# Patient Record
Sex: Female | Born: 1935 | Race: White | Hispanic: No | Marital: Married
Health system: Southern US, Community
[De-identification: ages and names within clinical notes are randomized; demographics above are authoritative.]

---

## 1997-06-12 ENCOUNTER — Other Ambulatory Visit: Admission: RE | Admit: 1997-06-12 | Discharge: 1997-06-12 | Payer: Self-pay | Admitting: Obstetrics and Gynecology

## 2000-06-29 ENCOUNTER — Encounter: Payer: Self-pay | Admitting: Family Medicine

## 2000-06-29 ENCOUNTER — Ambulatory Visit (HOSPITAL_COMMUNITY): Admission: RE | Admit: 2000-06-29 | Discharge: 2000-06-29 | Payer: Self-pay | Admitting: Family Medicine

## 2000-07-21 ENCOUNTER — Other Ambulatory Visit: Admission: RE | Admit: 2000-07-21 | Discharge: 2000-07-21 | Payer: Self-pay | Admitting: Family Medicine

## 2002-06-21 ENCOUNTER — Ambulatory Visit (HOSPITAL_COMMUNITY): Admission: RE | Admit: 2002-06-21 | Discharge: 2002-06-21 | Payer: Self-pay | Admitting: Family Medicine

## 2002-06-21 ENCOUNTER — Encounter: Payer: Self-pay | Admitting: Family Medicine

## 2002-06-28 ENCOUNTER — Other Ambulatory Visit: Admission: RE | Admit: 2002-06-28 | Discharge: 2002-06-28 | Payer: Self-pay | Admitting: Dermatology

## 2002-08-01 ENCOUNTER — Ambulatory Visit (HOSPITAL_COMMUNITY): Admission: RE | Admit: 2002-08-01 | Discharge: 2002-08-01 | Payer: Self-pay | Admitting: Family Medicine

## 2002-08-01 ENCOUNTER — Encounter: Payer: Self-pay | Admitting: Family Medicine

## 2003-08-03 ENCOUNTER — Ambulatory Visit (HOSPITAL_COMMUNITY): Admission: RE | Admit: 2003-08-03 | Discharge: 2003-08-03 | Payer: Self-pay | Admitting: Family Medicine

## 2004-08-05 ENCOUNTER — Ambulatory Visit (HOSPITAL_COMMUNITY): Admission: RE | Admit: 2004-08-05 | Discharge: 2004-08-05 | Payer: Self-pay | Admitting: Family Medicine

## 2005-01-06 ENCOUNTER — Ambulatory Visit (HOSPITAL_COMMUNITY): Admission: RE | Admit: 2005-01-06 | Discharge: 2005-01-06 | Payer: Self-pay | Admitting: Internal Medicine

## 2005-01-06 ENCOUNTER — Ambulatory Visit: Payer: Self-pay | Admitting: Internal Medicine

## 2005-09-03 ENCOUNTER — Ambulatory Visit (HOSPITAL_COMMUNITY): Admission: RE | Admit: 2005-09-03 | Discharge: 2005-09-03 | Payer: Self-pay | Admitting: Family Medicine

## 2006-07-20 ENCOUNTER — Ambulatory Visit (HOSPITAL_COMMUNITY): Admission: RE | Admit: 2006-07-20 | Discharge: 2006-07-20 | Payer: Self-pay | Admitting: Family Medicine

## 2006-09-07 ENCOUNTER — Ambulatory Visit (HOSPITAL_COMMUNITY): Admission: RE | Admit: 2006-09-07 | Discharge: 2006-09-07 | Payer: Self-pay | Admitting: Family Medicine

## 2007-09-14 ENCOUNTER — Ambulatory Visit (HOSPITAL_COMMUNITY): Admission: RE | Admit: 2007-09-14 | Discharge: 2007-09-14 | Payer: Self-pay | Admitting: Family Medicine

## 2008-08-14 ENCOUNTER — Ambulatory Visit (HOSPITAL_COMMUNITY): Admission: RE | Admit: 2008-08-14 | Discharge: 2008-08-14 | Payer: Self-pay | Admitting: Family Medicine

## 2008-10-02 ENCOUNTER — Ambulatory Visit (HOSPITAL_COMMUNITY): Admission: RE | Admit: 2008-10-02 | Discharge: 2008-10-02 | Payer: Self-pay | Admitting: Family Medicine

## 2009-10-15 ENCOUNTER — Ambulatory Visit (HOSPITAL_COMMUNITY): Admission: RE | Admit: 2009-10-15 | Discharge: 2009-10-15 | Payer: Self-pay | Admitting: Internal Medicine

## 2010-06-27 NOTE — Op Note (Signed)
NAMEAVERIANA, [name]               ACCOUNT NO.:  [id number]   MEDICAL RECORD NO.:  [id number]          PATIENT TYPE:  AMB   LOCATION:  DAY                           FACILITY:  [facility]   PHYSICIAN:  Lionel December, M.D.    DATE OF BIRTH:  1934-05-31   DATE OF PROCEDURE:  01/06/2005  DATE OF DISCHARGE:                                 OPERATIVE REPORT   PROCEDURE:  Colonoscopy.   INDICATION:  [name] is a 76 year old Caucasian female who is here for  screening colonoscopy. Family history is negative for colorectal carcinoma.   Procedure risks were reviewed with the patient, and informed consent was  obtained.   PREMEDICATION:  Demerol 50 mg IV, Versed 3 mg IV in divided dose.   FINDINGS:  Procedure performed in endoscopy suite. The patient's vital signs  and O2 saturation were monitored during the procedure and remained stable.  The patient was placed in left lateral position. Rectal examination  performed. No abnormality noted on external or digital exam. Olympus  videoscope was placed in rectum and advanced under vision into sigmoid colon  and beyond. Preparation was excellent. Scattered diverticula were noted at  sigmoid colon with few more at descending colon. Scope was passed to cecum  which was identified by ileocecal valve and appendiceal orifice. Pictures  taken for the record. As the scope was withdrawn, colonic mucosa was  examined for the second time and there were no polyps and/or tumor masses.  Rectal mucosa similarly was normal. Scope was retroflexed to examine  anorectal junction, and single small papilla was noted along with small  hemorrhoids below the dentate line. Endoscope was straightened and  withdrawn. The patient tolerated the procedure well.   FINAL DIAGNOSIS:  1.  Few scattered diverticula at sigmoid and descending colon.  2.  Small external hemorrhoids with single anal papilla.   RECOMMENDATIONS:  1.  High-fiber diet.  2.  Yearly Hemoccults.  3.  She may  consider next screening exam in 10 years from now.      Lionel December, M.D.  Electronically Signed     NR/MEDQ  D:  01/06/2005  T:  01/06/2005  Job:  [id number]   cc:   Patrica Duel, M.D.  Fax: [phone]

## 2010-09-24 ENCOUNTER — Other Ambulatory Visit (HOSPITAL_COMMUNITY): Payer: Self-pay | Admitting: Internal Medicine

## 2010-09-24 DIAGNOSIS — Z01419 Encounter for gynecological examination (general) (routine) without abnormal findings: Secondary | ICD-10-CM

## 2010-09-24 DIAGNOSIS — Z139 Encounter for screening, unspecified: Secondary | ICD-10-CM

## 2010-09-26 ENCOUNTER — Other Ambulatory Visit (HOSPITAL_COMMUNITY): Payer: Self-pay

## 2010-09-30 ENCOUNTER — Other Ambulatory Visit (HOSPITAL_COMMUNITY): Payer: Self-pay | Admitting: Internal Medicine

## 2010-09-30 DIAGNOSIS — Z139 Encounter for screening, unspecified: Secondary | ICD-10-CM

## 2010-10-17 ENCOUNTER — Ambulatory Visit (HOSPITAL_COMMUNITY)
Admission: RE | Admit: 2010-10-17 | Discharge: 2010-10-17 | Disposition: A | Discharge status: Home / Self Care | Payer: Medicare Other | Source: Ambulatory Visit | Attending: Internal Medicine | Admitting: Internal Medicine

## 2010-10-17 DIAGNOSIS — Z1231 Encounter for screening mammogram for malignant neoplasm of breast: Secondary | ICD-10-CM | POA: Insufficient documentation

## 2010-10-17 DIAGNOSIS — Z139 Encounter for screening, unspecified: Secondary | ICD-10-CM

## 2011-06-10 ENCOUNTER — Other Ambulatory Visit (HOSPITAL_COMMUNITY): Payer: Self-pay | Admitting: Physician Assistant

## 2011-06-10 DIAGNOSIS — R1012 Left upper quadrant pain: Secondary | ICD-10-CM

## 2011-06-16 ENCOUNTER — Ambulatory Visit (HOSPITAL_COMMUNITY)
Admission: RE | Admit: 2011-06-16 | Discharge: 2011-06-16 | Disposition: A | Discharge status: Home / Self Care | Payer: Medicare Other | Source: Ambulatory Visit | Attending: Physician Assistant | Admitting: Physician Assistant

## 2011-06-16 DIAGNOSIS — R1012 Left upper quadrant pain: Secondary | ICD-10-CM

## 2011-06-16 DIAGNOSIS — R109 Unspecified abdominal pain: Secondary | ICD-10-CM | POA: Insufficient documentation

## 2011-06-16 DIAGNOSIS — R918 Other nonspecific abnormal finding of lung field: Secondary | ICD-10-CM | POA: Insufficient documentation

## 2011-09-25 ENCOUNTER — Other Ambulatory Visit (HOSPITAL_COMMUNITY): Payer: Self-pay | Admitting: Internal Medicine

## 2011-09-25 DIAGNOSIS — R109 Unspecified abdominal pain: Secondary | ICD-10-CM

## 2011-09-30 ENCOUNTER — Ambulatory Visit (HOSPITAL_COMMUNITY)
Admission: RE | Admit: 2011-09-30 | Discharge: 2011-09-30 | Disposition: A | Discharge status: Home / Self Care | Payer: Medicare Other | Source: Ambulatory Visit | Attending: Internal Medicine | Admitting: Internal Medicine

## 2011-09-30 DIAGNOSIS — Z01419 Encounter for gynecological examination (general) (routine) without abnormal findings: Secondary | ICD-10-CM

## 2011-09-30 DIAGNOSIS — Z139 Encounter for screening, unspecified: Secondary | ICD-10-CM

## 2011-09-30 DIAGNOSIS — M949 Disorder of cartilage, unspecified: Secondary | ICD-10-CM | POA: Insufficient documentation

## 2011-09-30 DIAGNOSIS — M899 Disorder of bone, unspecified: Secondary | ICD-10-CM | POA: Insufficient documentation

## 2011-10-01 ENCOUNTER — Encounter (INDEPENDENT_AMBULATORY_CARE_PROVIDER_SITE_OTHER): Payer: Self-pay | Admitting: *Deleted

## 2011-10-01 ENCOUNTER — Ambulatory Visit (HOSPITAL_COMMUNITY)
Admission: RE | Admit: 2011-10-01 | Discharge: 2011-10-01 | Disposition: A | Discharge status: Home / Self Care | Payer: Medicare Other | Source: Ambulatory Visit | Attending: Internal Medicine | Admitting: Internal Medicine

## 2011-10-01 ENCOUNTER — Ambulatory Visit (HOSPITAL_COMMUNITY): Payer: Medicare Other

## 2011-10-01 DIAGNOSIS — R109 Unspecified abdominal pain: Secondary | ICD-10-CM

## 2011-10-01 DIAGNOSIS — R1032 Left lower quadrant pain: Secondary | ICD-10-CM | POA: Insufficient documentation

## 2011-10-01 MED ORDER — IOHEXOL 300 MG/ML  SOLN
100.0000 mL | Freq: Once | INTRAMUSCULAR | Status: AC | PRN
  Administered 2011-10-01: 100 mL via INTRAVENOUS

## 2011-11-11 ENCOUNTER — Other Ambulatory Visit (HOSPITAL_COMMUNITY): Payer: Self-pay | Admitting: Internal Medicine

## 2011-11-11 DIAGNOSIS — Z139 Encounter for screening, unspecified: Secondary | ICD-10-CM

## 2011-11-17 ENCOUNTER — Ambulatory Visit (HOSPITAL_COMMUNITY)
Admission: RE | Admit: 2011-11-17 | Discharge: 2011-11-17 | Disposition: A | Discharge status: Home / Self Care | Payer: Medicare Other | Source: Ambulatory Visit | Attending: Internal Medicine | Admitting: Internal Medicine

## 2011-11-17 DIAGNOSIS — Z1231 Encounter for screening mammogram for malignant neoplasm of breast: Secondary | ICD-10-CM | POA: Insufficient documentation

## 2011-11-17 DIAGNOSIS — Z139 Encounter for screening, unspecified: Secondary | ICD-10-CM

## 2011-11-24 ENCOUNTER — Ambulatory Visit (INDEPENDENT_AMBULATORY_CARE_PROVIDER_SITE_OTHER): Payer: Medicare Other | Admitting: Internal Medicine

## 2012-11-24 ENCOUNTER — Other Ambulatory Visit (HOSPITAL_COMMUNITY): Payer: Self-pay | Admitting: Internal Medicine

## 2012-11-24 DIAGNOSIS — Z139 Encounter for screening, unspecified: Secondary | ICD-10-CM

## 2012-11-29 ENCOUNTER — Ambulatory Visit (HOSPITAL_COMMUNITY)
Admission: RE | Admit: 2012-11-29 | Discharge: 2012-11-29 | Disposition: A | Discharge status: Home / Self Care | Payer: Medicare Other | Source: Ambulatory Visit | Attending: Internal Medicine | Admitting: Internal Medicine

## 2012-11-29 DIAGNOSIS — Z139 Encounter for screening, unspecified: Secondary | ICD-10-CM

## 2012-11-29 DIAGNOSIS — Z1231 Encounter for screening mammogram for malignant neoplasm of breast: Secondary | ICD-10-CM | POA: Insufficient documentation

## 2013-11-20 ENCOUNTER — Other Ambulatory Visit (HOSPITAL_COMMUNITY): Payer: Self-pay | Admitting: Internal Medicine

## 2013-11-20 DIAGNOSIS — R109 Unspecified abdominal pain: Secondary | ICD-10-CM

## 2013-11-20 DIAGNOSIS — R319 Hematuria, unspecified: Secondary | ICD-10-CM

## 2013-11-23 ENCOUNTER — Ambulatory Visit (HOSPITAL_COMMUNITY)
Admission: RE | Admit: 2013-11-23 | Discharge: 2013-11-23 | Disposition: A | Discharge status: Home / Self Care | Payer: Medicare PPO | Source: Ambulatory Visit | Attending: Internal Medicine | Admitting: Internal Medicine

## 2013-11-23 DIAGNOSIS — R911 Solitary pulmonary nodule: Secondary | ICD-10-CM | POA: Diagnosis not present

## 2013-11-23 DIAGNOSIS — K5730 Diverticulosis of large intestine without perforation or abscess without bleeding: Secondary | ICD-10-CM | POA: Insufficient documentation

## 2013-11-23 DIAGNOSIS — R319 Hematuria, unspecified: Secondary | ICD-10-CM | POA: Diagnosis not present

## 2013-11-23 DIAGNOSIS — R1032 Left lower quadrant pain: Secondary | ICD-10-CM | POA: Diagnosis present

## 2013-11-23 DIAGNOSIS — I7090 Unspecified atherosclerosis: Secondary | ICD-10-CM | POA: Insufficient documentation

## 2013-11-23 DIAGNOSIS — R109 Unspecified abdominal pain: Secondary | ICD-10-CM

## 2013-11-23 MED ORDER — IOHEXOL 300 MG/ML  SOLN
100.0000 mL | Freq: Once | INTRAMUSCULAR | Status: AC | PRN
  Administered 2013-11-23: 100 mL via INTRAVENOUS

## 2013-11-24 ENCOUNTER — Other Ambulatory Visit (HOSPITAL_COMMUNITY): Payer: Self-pay | Admitting: Internal Medicine

## 2013-11-24 DIAGNOSIS — Z01419 Encounter for gynecological examination (general) (routine) without abnormal findings: Secondary | ICD-10-CM

## 2013-11-24 DIAGNOSIS — Z0000 Encounter for general adult medical examination without abnormal findings: Secondary | ICD-10-CM

## 2013-11-27 ENCOUNTER — Other Ambulatory Visit (HOSPITAL_COMMUNITY): Payer: Self-pay | Admitting: Internal Medicine

## 2013-11-27 DIAGNOSIS — Z0000 Encounter for general adult medical examination without abnormal findings: Secondary | ICD-10-CM

## 2013-12-05 ENCOUNTER — Other Ambulatory Visit (HOSPITAL_COMMUNITY): Payer: Medicare Other

## 2013-12-05 ENCOUNTER — Ambulatory Visit (HOSPITAL_COMMUNITY): Payer: Medicare Other

## 2013-12-07 ENCOUNTER — Ambulatory Visit (HOSPITAL_COMMUNITY)
Admission: RE | Admit: 2013-12-07 | Discharge: 2013-12-07 | Disposition: A | Discharge status: Home / Self Care | Payer: Medicare HMO | Source: Ambulatory Visit | Attending: Internal Medicine | Admitting: Internal Medicine

## 2013-12-07 DIAGNOSIS — I672 Cerebral atherosclerosis: Secondary | ICD-10-CM | POA: Insufficient documentation

## 2013-12-07 DIAGNOSIS — Z0000 Encounter for general adult medical examination without abnormal findings: Secondary | ICD-10-CM | POA: Insufficient documentation

## 2013-12-07 DIAGNOSIS — E780 Pure hypercholesterolemia: Secondary | ICD-10-CM | POA: Diagnosis not present

## 2013-12-14 NOTE — H&P (Signed)
  NTS SOAP Note  Vital Signs:  Vitals as of: 12/14/2013: Systolic 156: Diastolic 78: Heart Rate 77: Temp 20F: Height 545ft 4in: Weight 128Lbs 0 Ounces: BMI 21.97  BMI : 21.97 kg/m2  Subjective: This 79 year old female presents forneed for screening TCS.  Last had a TCS over ten years ago.  No family h/o colon cancer.  No gi complaints.  Review of Symptoms:  Constitutional:unremarkable   Head:unremarkable Eyes:unremarkable   Nose/Mouth/Throat:unremarkable Cardiovascular:  unremarkable Respiratory:unremarkable Gastrointestinal:  unremarkable   Genitourinary:unremarkable   Musculoskeletal:unremarkable Skin:unremarkable Hematolgic/Lymphatic:unremarkable   Allergic/Immunologic:unremarkable   Past Medical History:  Reviewed  Past Medical History  Surgical History: unremarkable Medical Problems: nnone Allergies: nkda Medications: ASA   Social History:Reviewed  Social History  Preferred Language: English Race:  White Ethnicity: Not Hispanic / Latino Age: [age] year Marital Status:  M Alcohol: no   Smoking Status: Never smoker reviewed on 12/12/2013 Functional Status reviewed on 12/12/2013 ------------------------------------------------ Bathing: Normal Cooking: Normal Dressing: Normal Driving: Normal Eating: Normal Managing Meds: Normal Oral Care: Normal Shopping: Normal Toileting: Normal Transferring: Normal Walking: Normal Cognitive Status reviewed on 12/12/2013 ------------------------------------------------ Attention: Normal Decision Making: Normal Language: Normal Memory: Normal Motor: Normal Perception: Normal Problem Solving: Normal Visual and Spatial: Normal   Family History:Reviewed  Family Health History Mother, Deceased; Healthy;  Father, Deceased; Healthy;     Objective Information: General:Well appearing, well nourished in no distress. Heart:RRR, no murmur or gallop.  Normal S1, S2.  No S3, S4.  Lungs:  CTA  bilaterally, no wheezes, rhonchi, rales.  Breathing unlabored. Abdomen:Soft, NT/ND, no HSM, no masses. deferred to procedure  Assessment:Need for screening TCS  Diagnoses: V76.51  Z12.11 Screening for malignant neoplasm of colon (Encounter for screening for malignant neoplasm of colon)  Procedures: [id number] - OFFICE OUTPATIENT NEW 20 MINUTES    Plan:  Scheduled for TCS on 01/09/14.   Patient Education:Alternative treatments to surgery were discussed with patient (and family).  Risks and benefits  of procedure including bleeding and perforation were fully explained to the patient (and family) who gave informed consent. Patient/family questions were addressed.  To hold ASA prior to procedure.  Follow-up:Pending Surgery

## 2014-01-09 ENCOUNTER — Encounter (HOSPITAL_COMMUNITY)
Admission: RE | Disposition: A | Discharge status: Home / Self Care | Payer: Self-pay | Source: Ambulatory Visit | Attending: General Surgery

## 2014-01-09 ENCOUNTER — Ambulatory Visit (HOSPITAL_COMMUNITY)
Admission: RE | Admit: 2014-01-09 | Discharge: 2014-01-09 | Disposition: A | Discharge status: Home / Self Care | Payer: Commercial Managed Care - HMO | Source: Ambulatory Visit | Attending: General Surgery | Admitting: General Surgery

## 2014-01-09 DIAGNOSIS — K5730 Diverticulosis of large intestine without perforation or abscess without bleeding: Secondary | ICD-10-CM | POA: Insufficient documentation

## 2014-01-09 DIAGNOSIS — Z1211 Encounter for screening for malignant neoplasm of colon: Secondary | ICD-10-CM | POA: Diagnosis present

## 2014-01-09 HISTORY — PX: COLONOSCOPY: SHX5424

## 2014-01-09 SURGERY — COLONOSCOPY
Anesthesia: Moderate Sedation

## 2014-01-09 MED ORDER — MIDAZOLAM HCL 5 MG/5ML IJ SOLN
INTRAMUSCULAR | Status: AC
  Filled 2014-01-09: qty 5

## 2014-01-09 MED ORDER — SODIUM CHLORIDE 0.9 % IV SOLN
INTRAVENOUS | Status: DC
  Administered 2014-01-09: 1000 mL via INTRAVENOUS

## 2014-01-09 MED ORDER — MEPERIDINE HCL 50 MG/ML IJ SOLN
INTRAMUSCULAR | Status: DC | PRN
  Administered 2014-01-09: 50 mg via INTRAVENOUS

## 2014-01-09 MED ORDER — MEPERIDINE HCL 50 MG/ML IJ SOLN
INTRAMUSCULAR | Status: AC
  Filled 2014-01-09: qty 1

## 2014-01-09 MED ORDER — MIDAZOLAM HCL 5 MG/5ML IJ SOLN
INTRAMUSCULAR | Status: DC | PRN
Start: 2014-01-09 — End: 2014-01-09
  Administered 2014-01-09: 2 mg via INTRAVENOUS
  Administered 2014-01-09: 1 mg via INTRAVENOUS

## 2014-01-09 MED ORDER — STERILE WATER FOR IRRIGATION IR SOLN
Status: DC | PRN
  Administered 2014-01-09: 09:00:00

## 2014-01-09 NOTE — Interval H&P Note (Signed)
History and Physical Interval Note:  01/09/2014 8:32 AM  Shelby [name]  has presented today for surgery, with the diagnosis of screening  The various methods of treatment have been discussed with the patient and family. After consideration of risks, benefits and other options for treatment, the patient has consented to  Procedure(s) with comments: COLONOSCOPY (N/A) - 8:30 as a surgical intervention .  The patient's history has been reviewed, patient examined, no change in status, stable for surgery.  I have reviewed the patient's chart and labs.  Questions were answered to the patient's satisfaction.     Franky MachoJENKINS,[name]

## 2014-01-09 NOTE — Op Note (Signed)
[facility] [address] [place], [id number]   COLONOSCOPY PROCEDURE REPORT     EXAM DATE: 01/09/2014  PATIENT NAME:      Shelby [name], Shelby [name]           MR #:      [id number]  BIRTHDATE:       11-15-1934      VISIT #:     [id number]  ATTENDING:     Franky MachoMark Darica Goren, MD     STATUS:     outpatient REFERRING MD:      Artis DelayLarry Fusco, M.D. ASA CLASS:        Class I  INDICATIONS:  The patient is a 79 yr old female here for a colonoscopy due to average risk for colon cancer. PROCEDURE PERFORMED:     Colonoscopy, screening MEDICATIONS:     Demerol 50 mg IV and Versed 3 mg IV ESTIMATED BLOOD LOSS:     None  CONSENT: The patient understands the risks and benefits of the procedure and understands that these risks include, but are not limited to: sedation, allergic reaction, infection, perforation and/or bleeding. Alternative means of evaluation and treatment include, among others: physical exam, x-rays, and/or surgical intervention. The patient elects to proceed with this endoscopic procedure.  DESCRIPTION OF PROCEDURE: During intra-op preparation period all mechanical & medical equipment was checked for proper function. Hand hygiene and appropriate measures for infection prevention was taken. After the risks, benefits and alternatives of the procedure were thoroughly explained, Informed consent was verified, confirmed and timeout was successfully executed by the treatment team. A digital exam revealed no abnormalities of the rectum.      The EC-[id number] ([id number] endoscope was introduced through the anus and advanced to the cecum, which was identified by both the appendix and ileocecal valve. No adverse events experienced. The prep was adequate, using Trilyte. The instrument was then slowly withdrawn as the colon was fully examined.   COLON FINDINGS: Diverticula was found in the descending colon.  The opening was medium sized.   The examination was otherwise  normal. Retroflexed views revealed no abnormalities.  The scope was then completely withdrawn from the patient and the procedure terminated.  WITHDRAWAL TIME: 6 minutes 0 seconds    ADVERSE EVENTS:      There were no immediate complications.  IMPRESSIONS:     1.  Diverticula in the descending colon 2.  The examination was otherwise normal  RECOMMENDATIONS:     Return to the care of your primary provider. GI follow up as needed RECALL:  Franky MachoMark Eilan Mcinerny, MD eSigned:  Franky MachoMark Rebbeca Sheperd, MD 01/09/2014 8:55 AM   cc:  CPT CODES: ICD CODES:  The ICD and CPT codes recommended by this software are interpretations from the data that the clinical staff has captured with the software.  The verification of the translation of this report to the ICD and CPT codes and modifiers is the sole responsibility of the health care institution and practicing physician where this report was generated.  [employer] will not be held responsible for the validity of the ICD and CPT codes included on this report.  AMA assumes no liability for data contained or not contained herein. CPT is a Publishing rights managerregistered trademark of the [employer].

## 2014-01-09 NOTE — Discharge Instructions (Signed)

## 2014-01-10 ENCOUNTER — Encounter (HOSPITAL_COMMUNITY): Payer: Self-pay | Admitting: General Surgery

## 2014-05-23 ENCOUNTER — Other Ambulatory Visit (HOSPITAL_COMMUNITY): Payer: Self-pay | Admitting: Internal Medicine

## 2014-05-23 DIAGNOSIS — J984 Other disorders of lung: Secondary | ICD-10-CM

## 2014-05-28 ENCOUNTER — Ambulatory Visit (HOSPITAL_COMMUNITY)
Admission: RE | Admit: 2014-05-28 | Discharge: 2014-05-28 | Disposition: A | Discharge status: Home / Self Care | Payer: Commercial Managed Care - HMO | Source: Ambulatory Visit | Attending: Internal Medicine | Admitting: Internal Medicine

## 2014-05-28 DIAGNOSIS — R918 Other nonspecific abnormal finding of lung field: Secondary | ICD-10-CM | POA: Insufficient documentation

## 2014-05-28 DIAGNOSIS — J984 Other disorders of lung: Secondary | ICD-10-CM | POA: Diagnosis not present

## 2014-12-17 ENCOUNTER — Other Ambulatory Visit (HOSPITAL_COMMUNITY): Payer: Self-pay | Admitting: Internal Medicine

## 2014-12-17 DIAGNOSIS — Z1231 Encounter for screening mammogram for malignant neoplasm of breast: Secondary | ICD-10-CM

## 2014-12-18 DIAGNOSIS — R7309 Other abnormal glucose: Secondary | ICD-10-CM | POA: Diagnosis not present

## 2014-12-18 DIAGNOSIS — Z6821 Body mass index (BMI) 21.0-21.9, adult: Secondary | ICD-10-CM | POA: Diagnosis not present

## 2014-12-18 DIAGNOSIS — Z23 Encounter for immunization: Secondary | ICD-10-CM | POA: Diagnosis not present

## 2014-12-18 DIAGNOSIS — Z1389 Encounter for screening for other disorder: Secondary | ICD-10-CM | POA: Diagnosis not present

## 2014-12-18 DIAGNOSIS — R351 Nocturia: Secondary | ICD-10-CM | POA: Diagnosis not present

## 2014-12-18 DIAGNOSIS — Z0000 Encounter for general adult medical examination without abnormal findings: Secondary | ICD-10-CM | POA: Diagnosis not present

## 2014-12-24 ENCOUNTER — Ambulatory Visit (HOSPITAL_COMMUNITY)
Admission: RE | Admit: 2014-12-24 | Discharge: 2014-12-24 | Disposition: A | Discharge status: Home / Self Care | Payer: Commercial Managed Care - HMO | Source: Ambulatory Visit | Attending: Internal Medicine | Admitting: Internal Medicine

## 2014-12-24 DIAGNOSIS — Z1231 Encounter for screening mammogram for malignant neoplasm of breast: Secondary | ICD-10-CM | POA: Insufficient documentation

## 2014-12-24 DIAGNOSIS — N390 Urinary tract infection, site not specified: Secondary | ICD-10-CM | POA: Diagnosis not present

## 2015-01-09 DIAGNOSIS — Z0100 Encounter for examination of eyes and vision without abnormal findings: Secondary | ICD-10-CM | POA: Diagnosis not present

## 2015-02-05 ENCOUNTER — Other Ambulatory Visit (HOSPITAL_COMMUNITY): Payer: Self-pay | Admitting: Internal Medicine

## 2015-02-05 ENCOUNTER — Ambulatory Visit (HOSPITAL_COMMUNITY)
Admission: RE | Admit: 2015-02-05 | Discharge: 2015-02-05 | Disposition: A | Discharge status: Home / Self Care | Payer: Commercial Managed Care - HMO | Source: Ambulatory Visit | Attending: Internal Medicine | Admitting: Internal Medicine

## 2015-02-05 DIAGNOSIS — Z6822 Body mass index (BMI) 22.0-22.9, adult: Secondary | ICD-10-CM | POA: Diagnosis not present

## 2015-02-05 DIAGNOSIS — M79604 Pain in right leg: Secondary | ICD-10-CM | POA: Insufficient documentation

## 2015-02-05 DIAGNOSIS — M79661 Pain in right lower leg: Secondary | ICD-10-CM | POA: Diagnosis not present

## 2015-02-05 DIAGNOSIS — M779 Enthesopathy, unspecified: Secondary | ICD-10-CM | POA: Diagnosis not present

## 2015-02-05 DIAGNOSIS — Z1389 Encounter for screening for other disorder: Secondary | ICD-10-CM | POA: Diagnosis not present

## 2015-10-23 ENCOUNTER — Other Ambulatory Visit (HOSPITAL_COMMUNITY): Payer: Self-pay | Admitting: Family Medicine

## 2015-10-23 ENCOUNTER — Ambulatory Visit (HOSPITAL_COMMUNITY)
Admission: RE | Admit: 2015-10-23 | Discharge: 2015-10-23 | Disposition: A | Discharge status: Home / Self Care | Payer: Commercial Managed Care - HMO | Source: Ambulatory Visit | Attending: Family Medicine | Admitting: Family Medicine

## 2015-10-23 DIAGNOSIS — M5431 Sciatica, right side: Secondary | ICD-10-CM

## 2015-10-23 DIAGNOSIS — M5432 Sciatica, left side: Principal | ICD-10-CM

## 2015-10-23 DIAGNOSIS — M25552 Pain in left hip: Secondary | ICD-10-CM | POA: Diagnosis not present

## 2015-10-23 DIAGNOSIS — Z1389 Encounter for screening for other disorder: Secondary | ICD-10-CM | POA: Insufficient documentation

## 2015-10-23 DIAGNOSIS — Z6821 Body mass index (BMI) 21.0-21.9, adult: Secondary | ICD-10-CM | POA: Diagnosis not present

## 2015-12-19 ENCOUNTER — Other Ambulatory Visit (HOSPITAL_COMMUNITY): Payer: Self-pay | Admitting: Internal Medicine

## 2015-12-19 DIAGNOSIS — Z1231 Encounter for screening mammogram for malignant neoplasm of breast: Secondary | ICD-10-CM

## 2015-12-24 DIAGNOSIS — Z23 Encounter for immunization: Secondary | ICD-10-CM | POA: Diagnosis not present

## 2016-01-06 ENCOUNTER — Ambulatory Visit (HOSPITAL_COMMUNITY)
Admission: RE | Admit: 2016-01-06 | Discharge: 2016-01-06 | Disposition: A | Discharge status: Home / Self Care | Payer: Commercial Managed Care - HMO | Source: Ambulatory Visit | Attending: Internal Medicine | Admitting: Internal Medicine

## 2016-01-06 DIAGNOSIS — R928 Other abnormal and inconclusive findings on diagnostic imaging of breast: Secondary | ICD-10-CM | POA: Diagnosis not present

## 2016-01-06 DIAGNOSIS — Z1231 Encounter for screening mammogram for malignant neoplasm of breast: Secondary | ICD-10-CM

## 2016-01-09 ENCOUNTER — Other Ambulatory Visit: Payer: Self-pay | Admitting: Internal Medicine

## 2016-01-09 DIAGNOSIS — R928 Other abnormal and inconclusive findings on diagnostic imaging of breast: Secondary | ICD-10-CM

## 2016-01-28 ENCOUNTER — Ambulatory Visit (HOSPITAL_COMMUNITY)
Admission: RE | Admit: 2016-01-28 | Discharge: 2016-01-28 | Disposition: A | Discharge status: Home / Self Care | Payer: Commercial Managed Care - HMO | Source: Ambulatory Visit | Attending: Internal Medicine | Admitting: Internal Medicine

## 2016-01-28 ENCOUNTER — Encounter (HOSPITAL_COMMUNITY): Payer: Commercial Managed Care - HMO

## 2016-01-28 DIAGNOSIS — R928 Other abnormal and inconclusive findings on diagnostic imaging of breast: Secondary | ICD-10-CM

## 2016-01-28 DIAGNOSIS — N6310 Unspecified lump in the right breast, unspecified quadrant: Secondary | ICD-10-CM | POA: Diagnosis not present

## 2016-01-28 DIAGNOSIS — N6001 Solitary cyst of right breast: Secondary | ICD-10-CM | POA: Diagnosis not present

## 2016-02-12 DIAGNOSIS — E748 Other specified disorders of carbohydrate metabolism: Secondary | ICD-10-CM | POA: Diagnosis not present

## 2016-02-12 DIAGNOSIS — Z6821 Body mass index (BMI) 21.0-21.9, adult: Secondary | ICD-10-CM | POA: Diagnosis not present

## 2016-02-12 DIAGNOSIS — N3000 Acute cystitis without hematuria: Secondary | ICD-10-CM | POA: Diagnosis not present

## 2016-02-12 DIAGNOSIS — R7309 Other abnormal glucose: Secondary | ICD-10-CM | POA: Diagnosis not present

## 2016-02-12 DIAGNOSIS — Z1389 Encounter for screening for other disorder: Secondary | ICD-10-CM | POA: Diagnosis not present

## 2016-02-12 DIAGNOSIS — Z0000 Encounter for general adult medical examination without abnormal findings: Secondary | ICD-10-CM | POA: Diagnosis not present

## 2016-02-12 DIAGNOSIS — R300 Dysuria: Secondary | ICD-10-CM | POA: Diagnosis not present

## 2016-02-20 DIAGNOSIS — N3000 Acute cystitis without hematuria: Secondary | ICD-10-CM | POA: Diagnosis not present

## 2016-06-14 IMAGING — US US CAROTID DUPLEX BILAT
1 series · 13 of 24 positions shown · non-contrast
Comparison: None.

CLINICAL DATA: Elevated cholesterol level.

EXAM:
BILATERAL CAROTID DUPLEX ULTRASOUND
TECHNIQUE: Gray scale imaging, color Doppler and duplex ultrasound were
performed of bilateral carotid and vertebral arteries in the neck.

[Series 1: us carotid duplex bilat · 0.04mm/px · 13 of 70 slices shown]
[im 1/70]
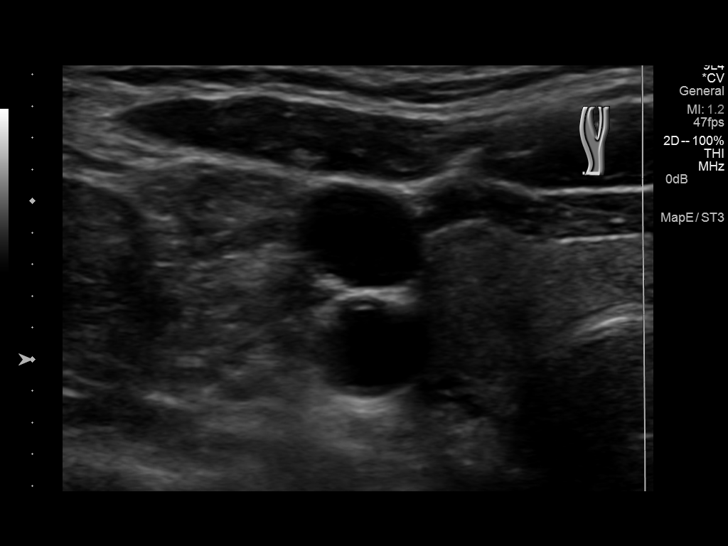
[im 7/70]
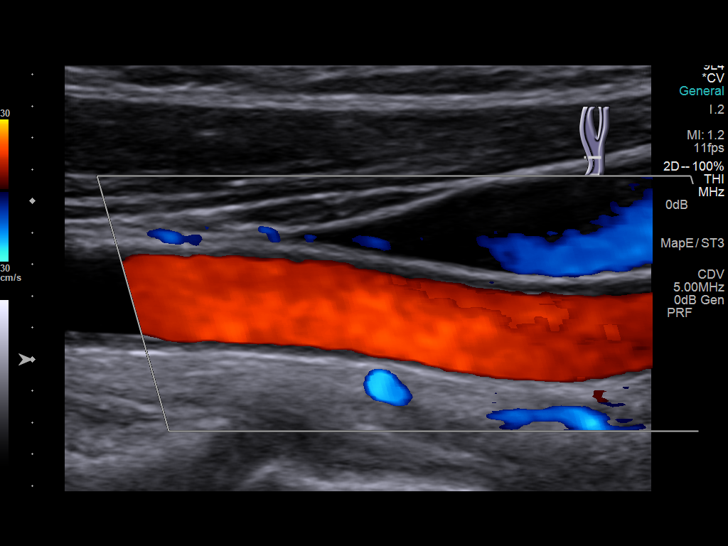
[im 13/70]
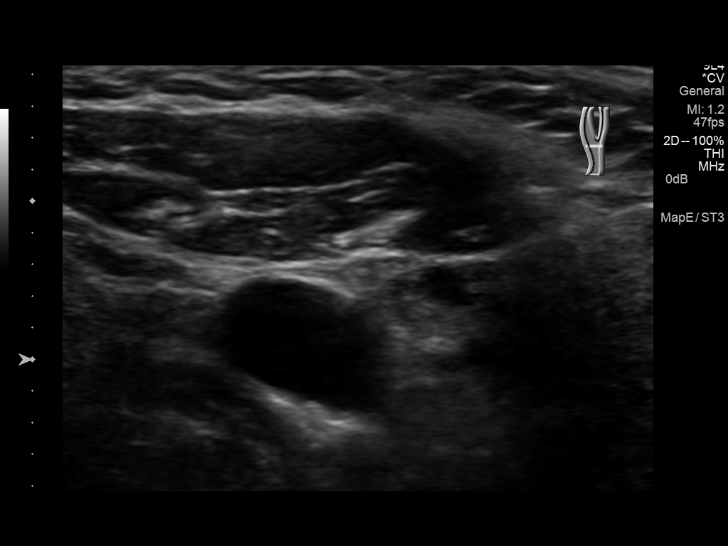
[im 19/70]
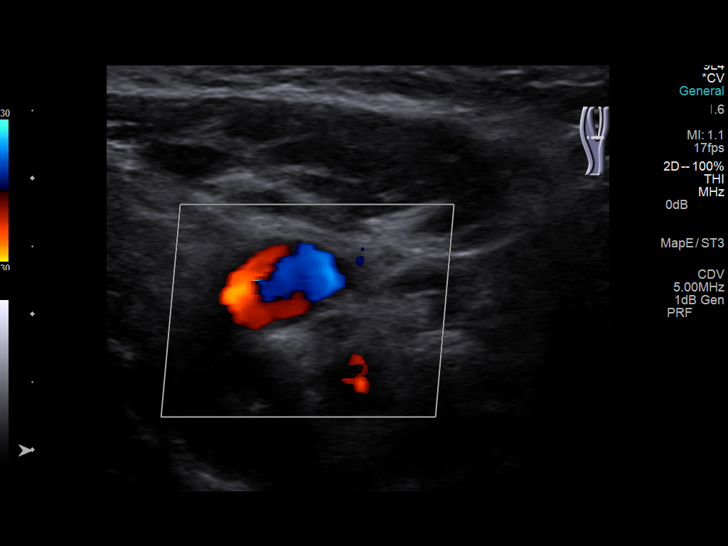
[im 25/70]
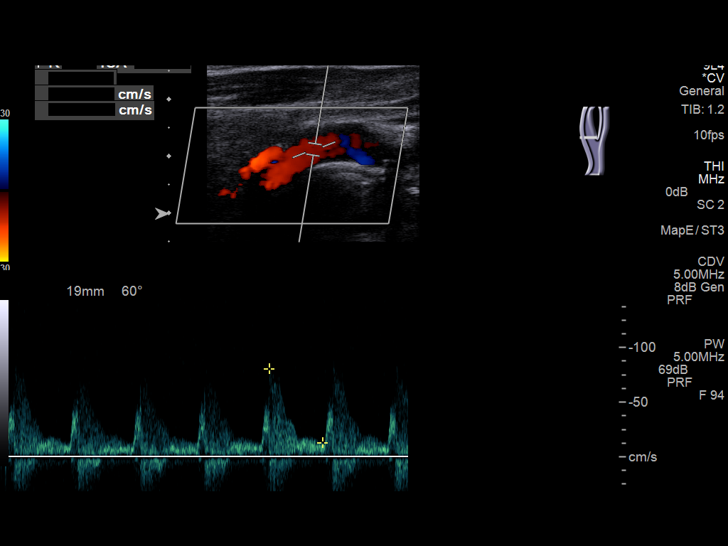
[im 31/70]
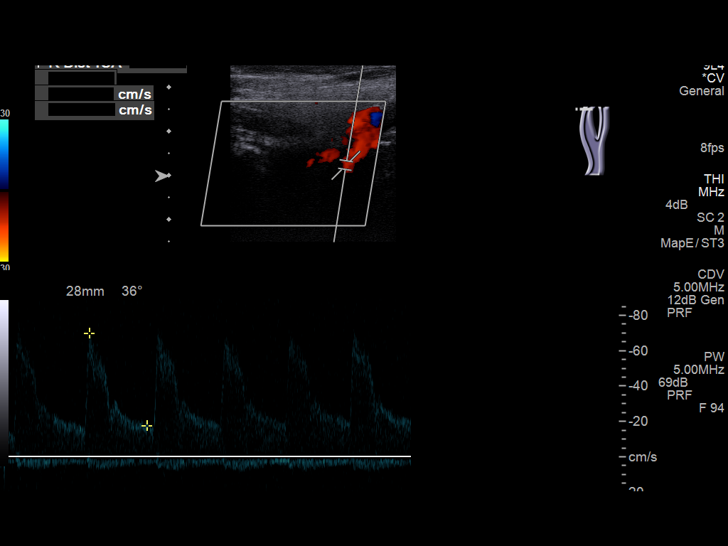
[im 37/70]
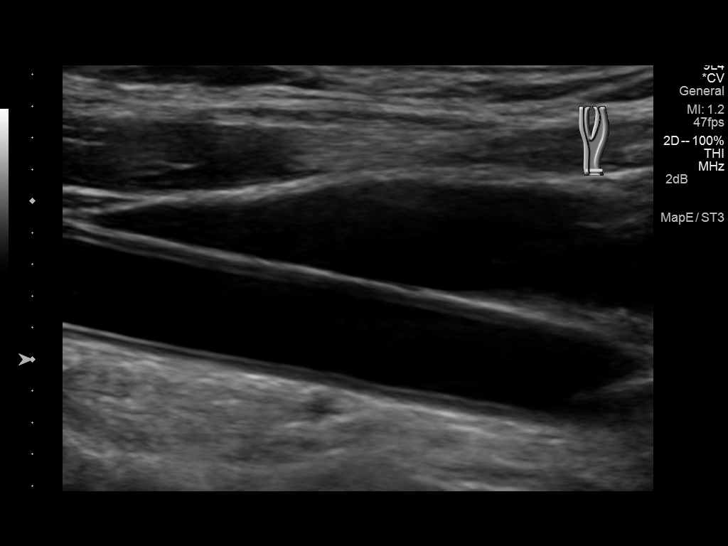
[im 40/70]
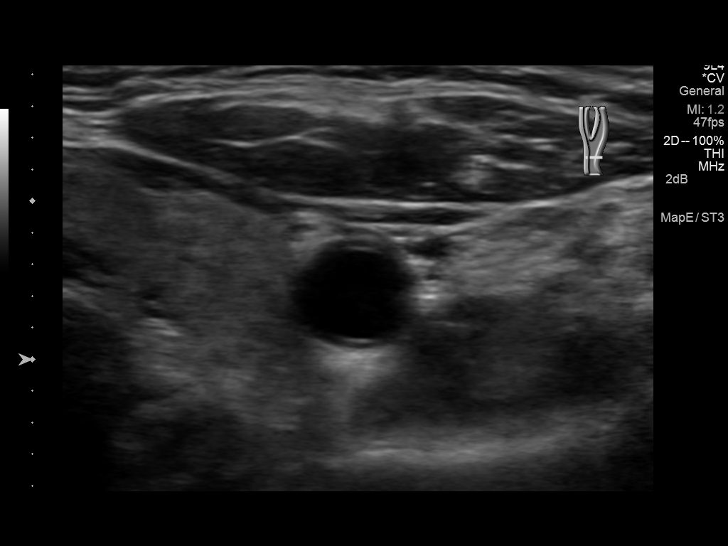
[im 46/70]
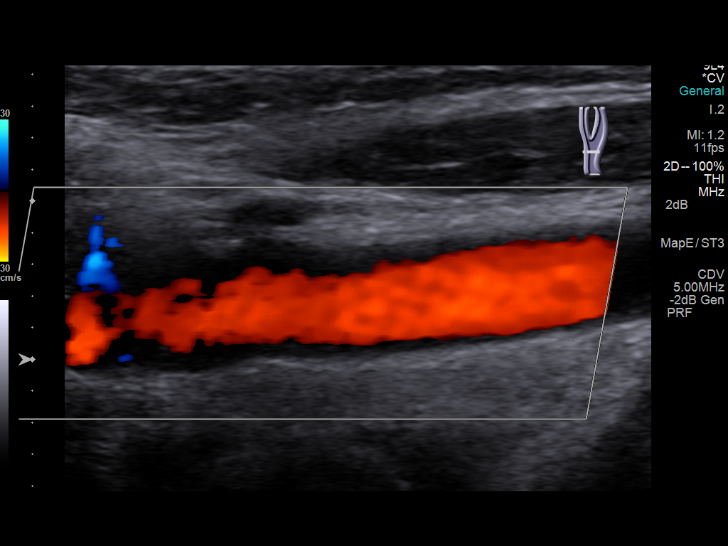
[im 52/70]
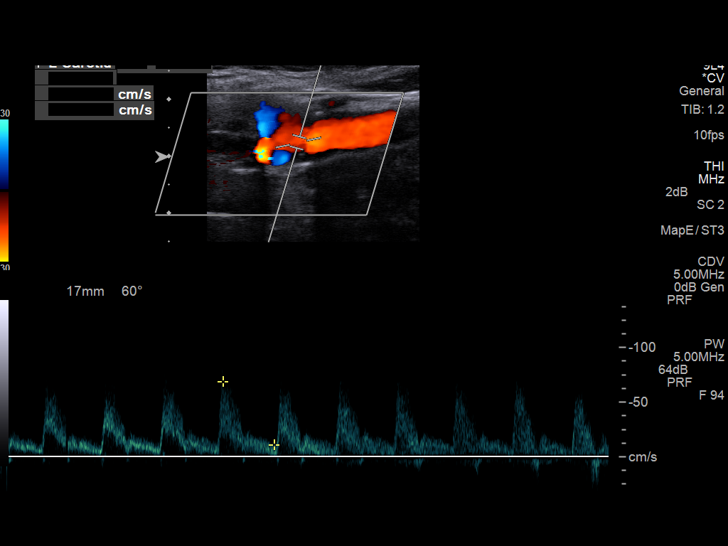
[im 58/70]
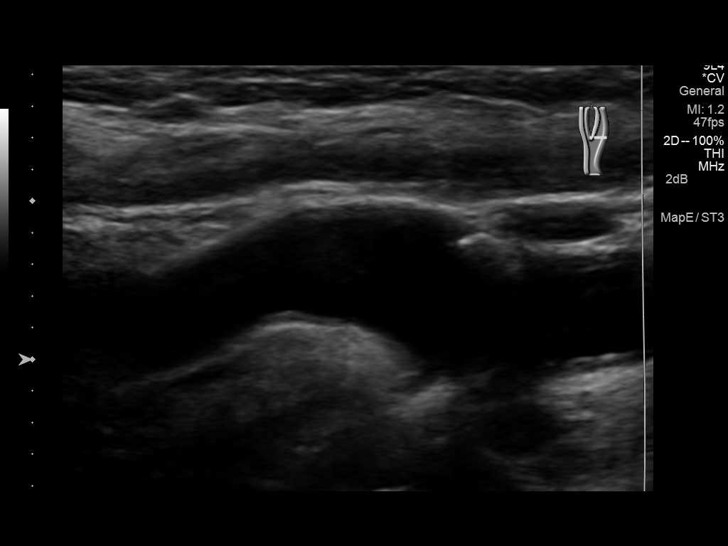
[im 64/70]
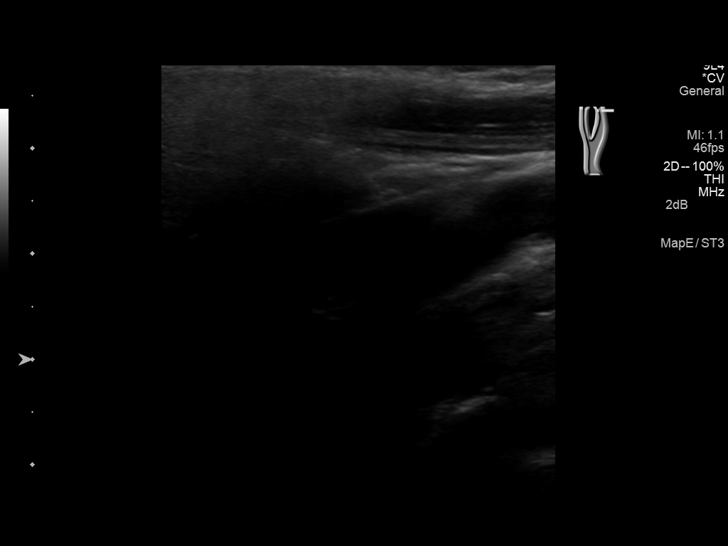
[im 70/70]
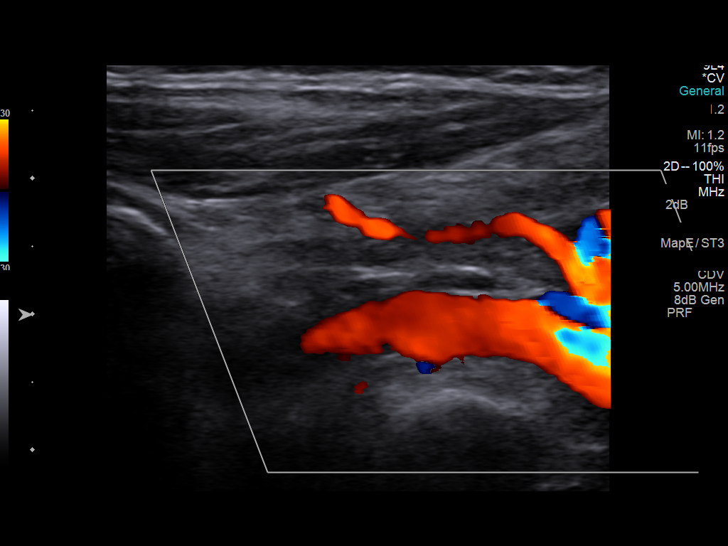

[13 of 24 positions shown; findings below may reference images not displayed]

FINDINGS: Criteria: Quantification of carotid stenosis is based on velocity
parameters that correlate the residual internal carotid diameter
with NASCET-based stenosis levels, using the diameter of the distal
internal carotid lumen as the denominator for stenosis measurement.

The following velocity measurements were obtained:

RIGHT

ICA:  81/13 cm/sec

CCA:  115/17 cm/sec

SYSTOLIC ICA/CCA RATIO:

DIASTOLIC ICA/CCA RATIO:

ECA:  82 cm/sec

LEFT

ICA:  73/17 cm/sec

CCA:  92/13 cm/sec

SYSTOLIC ICA/CCA RATIO:

DIASTOLIC ICA/CCA RATIO:

ECA:  56 cm/sec

RIGHT CAROTID ARTERY: Mild atheromatous disease is noted in the
right carotid bulb and proximal right internal carotid artery
consistent with less than 50% diameter stenosis based on ultrasound
and Doppler criteria.

RIGHT VERTEBRAL ARTERY:  Antegrade flow is noted.

LEFT CAROTID ARTERY: Mild atheromatous disease is noted in the left
carotid bulb and proximal left internal carotid artery.

LEFT VERTEBRAL ARTERY:  Antegrade flow is noted.
IMPRESSION: No hemodynamically significant stenosis or plaque is noted in either
cervical carotid artery.

## 2016-11-26 DIAGNOSIS — Z23 Encounter for immunization: Secondary | ICD-10-CM | POA: Diagnosis not present

## 2017-01-05 ENCOUNTER — Other Ambulatory Visit (HOSPITAL_COMMUNITY): Payer: Self-pay | Admitting: Internal Medicine

## 2017-01-05 DIAGNOSIS — Z1231 Encounter for screening mammogram for malignant neoplasm of breast: Secondary | ICD-10-CM

## 2017-01-29 ENCOUNTER — Ambulatory Visit (HOSPITAL_COMMUNITY)
Admission: RE | Admit: 2017-01-29 | Discharge: 2017-01-29 | Disposition: A | Discharge status: Home / Self Care | Payer: Medicare HMO | Source: Ambulatory Visit | Attending: Internal Medicine | Admitting: Internal Medicine

## 2017-01-29 DIAGNOSIS — Z1231 Encounter for screening mammogram for malignant neoplasm of breast: Secondary | ICD-10-CM | POA: Insufficient documentation

## 2017-02-04 ENCOUNTER — Other Ambulatory Visit (HOSPITAL_COMMUNITY): Payer: Self-pay | Admitting: Internal Medicine

## 2017-02-04 DIAGNOSIS — R928 Other abnormal and inconclusive findings on diagnostic imaging of breast: Secondary | ICD-10-CM

## 2017-02-12 ENCOUNTER — Other Ambulatory Visit (HOSPITAL_COMMUNITY): Payer: Self-pay | Admitting: Internal Medicine

## 2017-02-12 DIAGNOSIS — N390 Urinary tract infection, site not specified: Secondary | ICD-10-CM | POA: Diagnosis not present

## 2017-02-12 DIAGNOSIS — Z6821 Body mass index (BMI) 21.0-21.9, adult: Secondary | ICD-10-CM | POA: Diagnosis not present

## 2017-02-12 DIAGNOSIS — E748 Other specified disorders of carbohydrate metabolism: Secondary | ICD-10-CM | POA: Diagnosis not present

## 2017-02-12 DIAGNOSIS — Z0001 Encounter for general adult medical examination with abnormal findings: Secondary | ICD-10-CM | POA: Diagnosis not present

## 2017-02-12 DIAGNOSIS — R0989 Other specified symptoms and signs involving the circulatory and respiratory systems: Secondary | ICD-10-CM

## 2017-02-12 DIAGNOSIS — R351 Nocturia: Secondary | ICD-10-CM | POA: Diagnosis not present

## 2017-02-12 DIAGNOSIS — Z1389 Encounter for screening for other disorder: Secondary | ICD-10-CM | POA: Diagnosis not present

## 2017-02-12 DIAGNOSIS — R7309 Other abnormal glucose: Secondary | ICD-10-CM | POA: Diagnosis not present

## 2017-02-12 DIAGNOSIS — E782 Mixed hyperlipidemia: Secondary | ICD-10-CM | POA: Diagnosis not present

## 2017-02-16 ENCOUNTER — Ambulatory Visit (HOSPITAL_COMMUNITY)
Admission: RE | Admit: 2017-02-16 | Discharge: 2017-02-16 | Disposition: A | Discharge status: Home / Self Care | Payer: Medicare HMO | Source: Ambulatory Visit | Attending: Internal Medicine | Admitting: Internal Medicine

## 2017-02-16 DIAGNOSIS — N6489 Other specified disorders of breast: Secondary | ICD-10-CM | POA: Diagnosis not present

## 2017-02-16 DIAGNOSIS — R928 Other abnormal and inconclusive findings on diagnostic imaging of breast: Secondary | ICD-10-CM | POA: Diagnosis not present

## 2017-02-16 DIAGNOSIS — I6523 Occlusion and stenosis of bilateral carotid arteries: Secondary | ICD-10-CM | POA: Insufficient documentation

## 2017-02-16 DIAGNOSIS — R0989 Other specified symptoms and signs involving the circulatory and respiratory systems: Secondary | ICD-10-CM | POA: Diagnosis present

## 2017-02-24 DIAGNOSIS — N390 Urinary tract infection, site not specified: Secondary | ICD-10-CM | POA: Diagnosis not present

## 2017-03-11 DIAGNOSIS — E748 Other specified disorders of carbohydrate metabolism: Secondary | ICD-10-CM | POA: Diagnosis not present

## 2017-03-11 DIAGNOSIS — R7309 Other abnormal glucose: Secondary | ICD-10-CM | POA: Diagnosis not present

## 2017-11-30 DIAGNOSIS — Z23 Encounter for immunization: Secondary | ICD-10-CM | POA: Diagnosis not present

## 2018-01-17 ENCOUNTER — Other Ambulatory Visit (HOSPITAL_COMMUNITY): Payer: Self-pay | Admitting: Internal Medicine

## 2018-01-17 DIAGNOSIS — Z1231 Encounter for screening mammogram for malignant neoplasm of breast: Secondary | ICD-10-CM

## 2018-02-07 ENCOUNTER — Ambulatory Visit (HOSPITAL_COMMUNITY)
Admission: RE | Admit: 2018-02-07 | Discharge: 2018-02-07 | Disposition: A | Discharge status: Home / Self Care | Payer: Medicare HMO | Source: Ambulatory Visit | Attending: Internal Medicine | Admitting: Internal Medicine

## 2018-02-07 DIAGNOSIS — Z1231 Encounter for screening mammogram for malignant neoplasm of breast: Secondary | ICD-10-CM | POA: Diagnosis not present

## 2018-03-16 DIAGNOSIS — Z1389 Encounter for screening for other disorder: Secondary | ICD-10-CM | POA: Diagnosis not present

## 2018-03-16 DIAGNOSIS — Z6820 Body mass index (BMI) 20.0-20.9, adult: Secondary | ICD-10-CM | POA: Diagnosis not present

## 2018-03-16 DIAGNOSIS — N390 Urinary tract infection, site not specified: Secondary | ICD-10-CM | POA: Diagnosis not present

## 2018-03-16 DIAGNOSIS — E748 Other specified disorders of carbohydrate metabolism: Secondary | ICD-10-CM | POA: Diagnosis not present

## 2018-03-16 DIAGNOSIS — R300 Dysuria: Secondary | ICD-10-CM | POA: Diagnosis not present

## 2018-03-16 DIAGNOSIS — N342 Other urethritis: Secondary | ICD-10-CM | POA: Diagnosis not present

## 2018-03-16 DIAGNOSIS — Z0000 Encounter for general adult medical examination without abnormal findings: Secondary | ICD-10-CM | POA: Diagnosis not present

## 2018-03-16 DIAGNOSIS — Z0001 Encounter for general adult medical examination with abnormal findings: Secondary | ICD-10-CM | POA: Diagnosis not present

## 2018-03-23 DIAGNOSIS — R319 Hematuria, unspecified: Secondary | ICD-10-CM | POA: Diagnosis not present

## 2018-07-14 ENCOUNTER — Other Ambulatory Visit (HOSPITAL_COMMUNITY): Payer: Self-pay | Admitting: Internal Medicine

## 2018-07-14 ENCOUNTER — Ambulatory Visit (HOSPITAL_COMMUNITY)
Admission: RE | Admit: 2018-07-14 | Discharge: 2018-07-14 | Disposition: A | Discharge status: Home / Self Care | Payer: Medicare HMO | Source: Ambulatory Visit | Attending: Internal Medicine | Admitting: Internal Medicine

## 2018-07-14 ENCOUNTER — Other Ambulatory Visit: Payer: Self-pay

## 2018-07-14 DIAGNOSIS — R300 Dysuria: Secondary | ICD-10-CM | POA: Diagnosis not present

## 2018-07-14 DIAGNOSIS — Z6820 Body mass index (BMI) 20.0-20.9, adult: Secondary | ICD-10-CM | POA: Diagnosis not present

## 2018-07-14 DIAGNOSIS — R630 Anorexia: Secondary | ICD-10-CM | POA: Diagnosis not present

## 2018-07-14 DIAGNOSIS — R634 Abnormal weight loss: Secondary | ICD-10-CM | POA: Diagnosis not present

## 2018-07-14 DIAGNOSIS — N390 Urinary tract infection, site not specified: Secondary | ICD-10-CM | POA: Diagnosis not present

## 2018-07-14 DIAGNOSIS — Z1389 Encounter for screening for other disorder: Secondary | ICD-10-CM | POA: Diagnosis not present

## 2018-07-14 DIAGNOSIS — M545 Low back pain: Secondary | ICD-10-CM | POA: Diagnosis not present

## 2018-07-14 DIAGNOSIS — M549 Dorsalgia, unspecified: Secondary | ICD-10-CM | POA: Insufficient documentation

## 2018-07-21 DIAGNOSIS — S22080A Wedge compression fracture of T11-T12 vertebra, initial encounter for closed fracture: Secondary | ICD-10-CM | POA: Diagnosis not present

## 2018-07-26 DIAGNOSIS — N390 Urinary tract infection, site not specified: Secondary | ICD-10-CM | POA: Diagnosis not present

## 2018-09-01 DIAGNOSIS — S22080A Wedge compression fracture of T11-T12 vertebra, initial encounter for closed fracture: Secondary | ICD-10-CM | POA: Diagnosis not present

## 2018-11-23 DIAGNOSIS — R5383 Other fatigue: Secondary | ICD-10-CM | POA: Diagnosis not present

## 2018-11-23 DIAGNOSIS — Z6821 Body mass index (BMI) 21.0-21.9, adult: Secondary | ICD-10-CM | POA: Diagnosis not present

## 2018-11-23 DIAGNOSIS — G894 Chronic pain syndrome: Secondary | ICD-10-CM | POA: Diagnosis not present

## 2018-11-23 DIAGNOSIS — Z23 Encounter for immunization: Secondary | ICD-10-CM | POA: Diagnosis not present

## 2018-11-23 DIAGNOSIS — N390 Urinary tract infection, site not specified: Secondary | ICD-10-CM | POA: Diagnosis not present

## 2018-12-02 DIAGNOSIS — N390 Urinary tract infection, site not specified: Secondary | ICD-10-CM | POA: Diagnosis not present

## 2019-08-17 DIAGNOSIS — E7489 Other specified disorders of carbohydrate metabolism: Secondary | ICD-10-CM | POA: Diagnosis not present

## 2019-08-17 DIAGNOSIS — Z6820 Body mass index (BMI) 20.0-20.9, adult: Secondary | ICD-10-CM | POA: Diagnosis not present

## 2019-08-17 DIAGNOSIS — Z1389 Encounter for screening for other disorder: Secondary | ICD-10-CM | POA: Diagnosis not present

## 2019-08-17 DIAGNOSIS — R5383 Other fatigue: Secondary | ICD-10-CM | POA: Diagnosis not present

## 2019-08-17 DIAGNOSIS — G4762 Sleep related leg cramps: Secondary | ICD-10-CM | POA: Diagnosis not present

## 2019-08-17 DIAGNOSIS — E782 Mixed hyperlipidemia: Secondary | ICD-10-CM | POA: Diagnosis not present

## 2019-08-17 DIAGNOSIS — Z0000 Encounter for general adult medical examination without abnormal findings: Secondary | ICD-10-CM | POA: Diagnosis not present

## 2019-08-17 DIAGNOSIS — M1990 Unspecified osteoarthritis, unspecified site: Secondary | ICD-10-CM | POA: Diagnosis not present

## 2019-08-17 DIAGNOSIS — E538 Deficiency of other specified B group vitamins: Secondary | ICD-10-CM | POA: Diagnosis not present

## 2019-08-17 DIAGNOSIS — G894 Chronic pain syndrome: Secondary | ICD-10-CM | POA: Diagnosis not present

## 2019-08-17 DIAGNOSIS — R7309 Other abnormal glucose: Secondary | ICD-10-CM | POA: Diagnosis not present

## 2019-08-18 DIAGNOSIS — G894 Chronic pain syndrome: Secondary | ICD-10-CM | POA: Diagnosis not present

## 2019-08-18 DIAGNOSIS — Z1389 Encounter for screening for other disorder: Secondary | ICD-10-CM | POA: Diagnosis not present

## 2019-08-18 DIAGNOSIS — Z0000 Encounter for general adult medical examination without abnormal findings: Secondary | ICD-10-CM | POA: Diagnosis not present

## 2019-08-18 DIAGNOSIS — Z6820 Body mass index (BMI) 20.0-20.9, adult: Secondary | ICD-10-CM | POA: Diagnosis not present

## 2019-08-18 DIAGNOSIS — G4762 Sleep related leg cramps: Secondary | ICD-10-CM | POA: Diagnosis not present

## 2019-08-18 DIAGNOSIS — M1990 Unspecified osteoarthritis, unspecified site: Secondary | ICD-10-CM | POA: Diagnosis not present

## 2019-08-18 DIAGNOSIS — R5383 Other fatigue: Secondary | ICD-10-CM | POA: Diagnosis not present

## 2019-08-25 DIAGNOSIS — N390 Urinary tract infection, site not specified: Secondary | ICD-10-CM | POA: Diagnosis not present

## 2020-10-02 DIAGNOSIS — Z1331 Encounter for screening for depression: Secondary | ICD-10-CM | POA: Diagnosis not present

## 2020-10-02 DIAGNOSIS — G894 Chronic pain syndrome: Secondary | ICD-10-CM | POA: Diagnosis not present

## 2020-10-02 DIAGNOSIS — Z0001 Encounter for general adult medical examination with abnormal findings: Secondary | ICD-10-CM | POA: Diagnosis not present

## 2020-10-02 DIAGNOSIS — Z6820 Body mass index (BMI) 20.0-20.9, adult: Secondary | ICD-10-CM | POA: Diagnosis not present

## 2020-10-02 DIAGNOSIS — Z1389 Encounter for screening for other disorder: Secondary | ICD-10-CM | POA: Diagnosis not present

## 2020-10-02 DIAGNOSIS — R5383 Other fatigue: Secondary | ICD-10-CM | POA: Diagnosis not present

## 2020-10-02 DIAGNOSIS — R3129 Other microscopic hematuria: Secondary | ICD-10-CM | POA: Diagnosis not present

## 2020-10-02 DIAGNOSIS — E7489 Other specified disorders of carbohydrate metabolism: Secondary | ICD-10-CM | POA: Diagnosis not present

## 2020-10-17 DIAGNOSIS — R3129 Other microscopic hematuria: Secondary | ICD-10-CM | POA: Diagnosis not present

## 2021-02-26 DIAGNOSIS — J329 Chronic sinusitis, unspecified: Secondary | ICD-10-CM | POA: Diagnosis not present

## 2021-02-26 DIAGNOSIS — J309 Allergic rhinitis, unspecified: Secondary | ICD-10-CM | POA: Diagnosis not present

## 2021-07-15 DIAGNOSIS — E538 Deficiency of other specified B group vitamins: Secondary | ICD-10-CM | POA: Diagnosis not present

## 2021-07-15 DIAGNOSIS — G20 Parkinson's disease: Secondary | ICD-10-CM | POA: Diagnosis not present

## 2021-07-15 DIAGNOSIS — N390 Urinary tract infection, site not specified: Secondary | ICD-10-CM | POA: Diagnosis not present

## 2021-07-15 DIAGNOSIS — Z6820 Body mass index (BMI) 20.0-20.9, adult: Secondary | ICD-10-CM | POA: Diagnosis not present

## 2021-07-15 DIAGNOSIS — Z0001 Encounter for general adult medical examination with abnormal findings: Secondary | ICD-10-CM | POA: Diagnosis not present

## 2021-07-15 DIAGNOSIS — Z1331 Encounter for screening for depression: Secondary | ICD-10-CM | POA: Diagnosis not present

## 2021-07-15 DIAGNOSIS — E559 Vitamin D deficiency, unspecified: Secondary | ICD-10-CM | POA: Diagnosis not present

## 2021-07-15 DIAGNOSIS — E782 Mixed hyperlipidemia: Secondary | ICD-10-CM | POA: Diagnosis not present

## 2021-07-15 DIAGNOSIS — Z9229 Personal history of other drug therapy: Secondary | ICD-10-CM | POA: Diagnosis not present

## 2021-07-16 DIAGNOSIS — N390 Urinary tract infection, site not specified: Secondary | ICD-10-CM | POA: Diagnosis not present

## 2021-09-10 DIAGNOSIS — Z79899 Other long term (current) drug therapy: Secondary | ICD-10-CM | POA: Diagnosis not present

## 2021-09-10 DIAGNOSIS — R471 Dysarthria and anarthria: Secondary | ICD-10-CM | POA: Diagnosis not present

## 2021-09-10 DIAGNOSIS — G20 Parkinson's disease: Secondary | ICD-10-CM | POA: Diagnosis not present

## 2021-10-19 DIAGNOSIS — R5383 Other fatigue: Secondary | ICD-10-CM | POA: Diagnosis not present

## 2021-10-19 DIAGNOSIS — G20 Parkinson's disease: Secondary | ICD-10-CM | POA: Diagnosis not present

## 2021-10-19 DIAGNOSIS — R030 Elevated blood-pressure reading, without diagnosis of hypertension: Secondary | ICD-10-CM | POA: Diagnosis not present

## 2021-10-20 ENCOUNTER — Emergency Department (HOSPITAL_COMMUNITY): Payer: Medicare HMO

## 2021-10-20 ENCOUNTER — Inpatient Hospital Stay (HOSPITAL_COMMUNITY)
Admission: EM | Admit: 2021-10-20 | Discharge: 2021-11-09 | DRG: 917 | Disposition: E | Payer: Medicare HMO | Attending: Family Medicine | Admitting: Family Medicine

## 2021-10-20 ENCOUNTER — Encounter (HOSPITAL_COMMUNITY): Payer: Self-pay | Admitting: Emergency Medicine

## 2021-10-20 ENCOUNTER — Other Ambulatory Visit: Payer: Self-pay

## 2021-10-20 DIAGNOSIS — I161 Hypertensive emergency: Secondary | ICD-10-CM | POA: Diagnosis present

## 2021-10-20 DIAGNOSIS — Z66 Do not resuscitate: Secondary | ICD-10-CM | POA: Diagnosis present

## 2021-10-20 DIAGNOSIS — E872 Acidosis, unspecified: Secondary | ICD-10-CM | POA: Diagnosis present

## 2021-10-20 DIAGNOSIS — I469 Cardiac arrest, cause unspecified: Secondary | ICD-10-CM | POA: Diagnosis not present

## 2021-10-20 DIAGNOSIS — R778 Other specified abnormalities of plasma proteins: Secondary | ICD-10-CM

## 2021-10-20 DIAGNOSIS — F0280 Dementia in other diseases classified elsewhere without behavioral disturbance: Secondary | ICD-10-CM | POA: Diagnosis present

## 2021-10-20 DIAGNOSIS — G9341 Metabolic encephalopathy: Secondary | ICD-10-CM | POA: Diagnosis present

## 2021-10-20 DIAGNOSIS — T452X1A Poisoning by vitamins, accidental (unintentional), initial encounter: Principal | ICD-10-CM | POA: Diagnosis present

## 2021-10-20 DIAGNOSIS — Z515 Encounter for palliative care: Secondary | ICD-10-CM

## 2021-10-20 DIAGNOSIS — R1310 Dysphagia, unspecified: Secondary | ICD-10-CM | POA: Diagnosis present

## 2021-10-20 DIAGNOSIS — I248 Other forms of acute ischemic heart disease: Secondary | ICD-10-CM | POA: Diagnosis present

## 2021-10-20 DIAGNOSIS — I453 Trifascicular block: Secondary | ICD-10-CM | POA: Diagnosis present

## 2021-10-20 DIAGNOSIS — I10 Essential (primary) hypertension: Secondary | ICD-10-CM | POA: Diagnosis present

## 2021-10-20 DIAGNOSIS — E8809 Other disorders of plasma-protein metabolism, not elsewhere classified: Secondary | ICD-10-CM | POA: Diagnosis present

## 2021-10-20 DIAGNOSIS — R531 Weakness: Secondary | ICD-10-CM | POA: Diagnosis not present

## 2021-10-20 DIAGNOSIS — S0990XA Unspecified injury of head, initial encounter: Secondary | ICD-10-CM | POA: Diagnosis not present

## 2021-10-20 DIAGNOSIS — E785 Hyperlipidemia, unspecified: Secondary | ICD-10-CM | POA: Diagnosis present

## 2021-10-20 DIAGNOSIS — I160 Hypertensive urgency: Secondary | ICD-10-CM | POA: Diagnosis present

## 2021-10-20 DIAGNOSIS — I252 Old myocardial infarction: Secondary | ICD-10-CM

## 2021-10-20 DIAGNOSIS — E673 Hypervitaminosis D: Secondary | ICD-10-CM | POA: Diagnosis present

## 2021-10-20 DIAGNOSIS — R739 Hyperglycemia, unspecified: Secondary | ICD-10-CM | POA: Diagnosis present

## 2021-10-20 DIAGNOSIS — R29810 Facial weakness: Secondary | ICD-10-CM | POA: Diagnosis not present

## 2021-10-20 DIAGNOSIS — R339 Retention of urine, unspecified: Secondary | ICD-10-CM | POA: Diagnosis not present

## 2021-10-20 DIAGNOSIS — Z7189 Other specified counseling: Secondary | ICD-10-CM | POA: Diagnosis not present

## 2021-10-20 DIAGNOSIS — Z7982 Long term (current) use of aspirin: Secondary | ICD-10-CM

## 2021-10-20 DIAGNOSIS — E876 Hypokalemia: Secondary | ICD-10-CM | POA: Diagnosis present

## 2021-10-20 DIAGNOSIS — J9811 Atelectasis: Secondary | ICD-10-CM | POA: Diagnosis not present

## 2021-10-20 DIAGNOSIS — R7989 Other specified abnormal findings of blood chemistry: Secondary | ICD-10-CM

## 2021-10-20 DIAGNOSIS — J9601 Acute respiratory failure with hypoxia: Secondary | ICD-10-CM | POA: Diagnosis not present

## 2021-10-20 DIAGNOSIS — R9431 Abnormal electrocardiogram [ECG] [EKG]: Secondary | ICD-10-CM | POA: Diagnosis not present

## 2021-10-20 DIAGNOSIS — G9389 Other specified disorders of brain: Secondary | ICD-10-CM | POA: Diagnosis not present

## 2021-10-20 DIAGNOSIS — Z79899 Other long term (current) drug therapy: Secondary | ICD-10-CM

## 2021-10-20 DIAGNOSIS — G20 Parkinson's disease: Secondary | ICD-10-CM | POA: Diagnosis present

## 2021-10-20 LAB — CBC
HCT: 41.8 % (ref 36.0–46.0)
HCT: 41.9 % (ref 36.0–46.0)
Hemoglobin: 13.8 g/dL (ref 12.0–15.0)
Hemoglobin: 14 g/dL (ref 12.0–15.0)
MCH: 31.1 pg (ref 26.0–34.0)
MCH: 31.3 pg (ref 26.0–34.0)
MCHC: 33 g/dL (ref 30.0–36.0)
MCHC: 33.4 g/dL (ref 30.0–36.0)
MCV: 93.5 fL (ref 80.0–100.0)
MCV: 94.1 fL (ref 80.0–100.0)
Platelets: 215 10*3/uL (ref 150–400)
Platelets: 226 10*3/uL (ref 150–400)
RBC: 4.44 MIL/uL (ref 3.87–5.11)
RBC: 4.48 MIL/uL (ref 3.87–5.11)
RDW: 12.8 % (ref 11.5–15.5)
RDW: 12.8 % (ref 11.5–15.5)
WBC: 9.3 10*3/uL (ref 4.0–10.5)
WBC: 9.6 10*3/uL (ref 4.0–10.5)
nRBC: 0 % (ref 0.0–0.2)
nRBC: 0 % (ref 0.0–0.2)

## 2021-10-20 LAB — COMPREHENSIVE METABOLIC PANEL
ALT: 165 U/L — ABNORMAL HIGH (ref 0–44)
AST: 102 U/L — ABNORMAL HIGH (ref 15–41)
Albumin: 3.3 g/dL — ABNORMAL LOW (ref 3.5–5.0)
Alkaline Phosphatase: 204 U/L — ABNORMAL HIGH (ref 38–126)
Anion gap: 9 (ref 5–15)
BUN: 23 mg/dL (ref 8–23)
CO2: 31 mmol/L (ref 22–32)
Calcium: >15 mg/dL (ref 8.9–10.3)
Chloride: 96 mmol/L — ABNORMAL LOW (ref 98–111)
Creatinine, Ser: 1.06 mg/dL — ABNORMAL HIGH (ref 0.44–1.00)
GFR, Estimated: 51 mL/min — ABNORMAL LOW (ref >60)
Glucose, Bld: 147 mg/dL — ABNORMAL HIGH (ref 70–99)
Potassium: 3.7 mmol/L (ref 3.5–5.1)
Sodium: 136 mmol/L (ref 135–145)
Total Bilirubin: 1.1 mg/dL (ref 0.3–1.2)
Total Protein: 7 g/dL (ref 6.5–8.1)

## 2021-10-20 LAB — TSH: TSH: 0.865 u[IU]/mL (ref 0.350–4.500)

## 2021-10-20 LAB — BASIC METABOLIC PANEL
Anion gap: 8 (ref 5–15)
BUN: 24 mg/dL — ABNORMAL HIGH (ref 8–23)
CO2: 32 mmol/L (ref 22–32)
Calcium: >15 mg/dL (ref 8.9–10.3)
Chloride: 97 mmol/L — ABNORMAL LOW (ref 98–111)
Creatinine, Ser: 1.05 mg/dL — ABNORMAL HIGH (ref 0.44–1.00)
GFR, Estimated: 52 mL/min — ABNORMAL LOW (ref >60)
Glucose, Bld: 149 mg/dL — ABNORMAL HIGH (ref 70–99)
Potassium: 3.8 mmol/L (ref 3.5–5.1)
Sodium: 137 mmol/L (ref 135–145)

## 2021-10-20 LAB — CBG MONITORING, ED: Glucose-Capillary: 119 mg/dL — ABNORMAL HIGH (ref 70–99)

## 2021-10-20 LAB — MAGNESIUM: Magnesium: 1.6 mg/dL — ABNORMAL LOW (ref 1.7–2.4)

## 2021-10-20 LAB — TROPONIN I (HIGH SENSITIVITY)
Troponin I (High Sensitivity): 127 ng/L (ref <18)
Troponin I (High Sensitivity): 136 ng/L (ref <18)

## 2021-10-20 LAB — LIPASE, BLOOD: Lipase: 24 U/L (ref 11–51)

## 2021-10-20 LAB — VITAMIN D 25 HYDROXY (VIT D DEFICIENCY, FRACTURES): Vit D, 25-Hydroxy: 109.68 ng/mL — ABNORMAL HIGH (ref 30–100)

## 2021-10-20 MED ORDER — ASPIRIN 81 MG PO TBEC: 81.0000 mg | DELAYED_RELEASE_TABLET | Freq: Two times a day (BID) | ORAL | Status: DC

## 2021-10-20 MED ORDER — CLONIDINE HCL 0.1 MG PO TABS
0.1000 mg | ORAL_TABLET | Freq: Once | ORAL | Status: AC
  Administered 2021-10-20: 0.1 mg via ORAL
  Filled 2021-10-20: qty 1

## 2021-10-20 MED ORDER — AMLODIPINE BESYLATE 5 MG PO TABS
5.0000 mg | ORAL_TABLET | Freq: Every day | ORAL | Status: DC
  Administered 2021-10-21: 5 mg via ORAL
  Filled 2021-10-20: qty 1

## 2021-10-20 MED ORDER — LABETALOL HCL 5 MG/ML IV SOLN
10.0000 mg | INTRAVENOUS | Status: DC | PRN
  Administered 2021-10-20 – 2021-10-21 (×2): 10 mg via INTRAVENOUS
  Filled 2021-10-20 (×2): qty 4

## 2021-10-20 MED ORDER — METOPROLOL TARTRATE 25 MG PO TABS
25.0000 mg | ORAL_TABLET | Freq: Once | ORAL | Status: DC
  Filled 2021-10-20: qty 1

## 2021-10-20 MED ORDER — AMANTADINE HCL 100 MG PO CAPS
100.0000 mg | ORAL_CAPSULE | Freq: Every day | ORAL | Status: DC
  Filled 2021-10-20 (×4): qty 1

## 2021-10-20 MED ORDER — LABETALOL HCL 5 MG/ML IV SOLN: 5.0000 mg | Freq: Once | INTRAVENOUS | Status: DC

## 2021-10-20 MED ORDER — ONDANSETRON HCL 4 MG/2ML IJ SOLN: 4.0000 mg | Freq: Four times a day (QID) | INTRAMUSCULAR | Status: DC | PRN

## 2021-10-20 MED ORDER — GABAPENTIN 100 MG PO CAPS: 100.0000 mg | ORAL_CAPSULE | Freq: Three times a day (TID) | ORAL | Status: DC

## 2021-10-20 MED ORDER — ENOXAPARIN SODIUM 40 MG/0.4ML IJ SOSY
40.0000 mg | PREFILLED_SYRINGE | INTRAMUSCULAR | Status: DC
  Administered 2021-10-20: 40 mg via SUBCUTANEOUS
  Filled 2021-10-20: qty 0.4

## 2021-10-20 MED ORDER — EZETIMIBE 10 MG PO TABS
10.0000 mg | ORAL_TABLET | Freq: Every day | ORAL | Status: DC
  Administered 2021-10-21: 10 mg via ORAL
  Filled 2021-10-20: qty 1

## 2021-10-20 MED ORDER — ACETAMINOPHEN 325 MG PO TABS: 650.0000 mg | ORAL_TABLET | Freq: Four times a day (QID) | ORAL | Status: DC | PRN

## 2021-10-20 MED ORDER — HYDRALAZINE HCL 20 MG/ML IJ SOLN
20.0000 mg | Freq: Four times a day (QID) | INTRAMUSCULAR | Status: DC | PRN
  Administered 2021-10-20 – 2021-10-22 (×4): 20 mg via INTRAVENOUS
  Filled 2021-10-20 (×4): qty 1

## 2021-10-20 MED ORDER — ASPIRIN 81 MG PO TBEC
81.0000 mg | DELAYED_RELEASE_TABLET | Freq: Every day | ORAL | Status: DC
  Administered 2021-10-21: 81 mg via ORAL
  Filled 2021-10-20: qty 1

## 2021-10-20 MED ORDER — CHLORHEXIDINE GLUCONATE CLOTH 2 % EX PADS
6.0000 | MEDICATED_PAD | Freq: Every day | CUTANEOUS | Status: DC
  Administered 2021-10-21 – 2021-10-22 (×2): 6 via TOPICAL

## 2021-10-20 MED ORDER — ONDANSETRON HCL 4 MG PO TABS: 4.0000 mg | ORAL_TABLET | Freq: Four times a day (QID) | ORAL | Status: DC | PRN

## 2021-10-20 MED ORDER — LACTATED RINGERS IV BOLUS
1000.0000 mL | Freq: Once | INTRAVENOUS | Status: AC
  Administered 2021-10-20: 1000 mL via INTRAVENOUS

## 2021-10-20 MED ORDER — ACETAMINOPHEN 650 MG RE SUPP: 650.0000 mg | Freq: Four times a day (QID) | RECTAL | Status: DC | PRN

## 2021-10-20 MED ORDER — SODIUM CHLORIDE 0.9 % IV SOLN: INTRAVENOUS | Status: DC

## 2021-10-20 MED ORDER — LABETALOL HCL 5 MG/ML IV SOLN
5.0000 mg | Freq: Once | INTRAVENOUS | Status: AC
  Administered 2021-10-20: 5 mg via INTRAVENOUS
  Filled 2021-10-20: qty 4

## 2021-10-20 NOTE — H&P (Addendum)
History and Physical    HANLEY RISPOLI SVX:[id number] DOB: Jul 03, 1934 DOA: 11/05/2021  PCP: Elfredia Nevins, MD  Patient coming from: Home  I have personally briefly reviewed patient's old medical records in [facility]  Chief Complaint: Generalized weakness HPI: SHAKEERAH GRADEL is a 87 y.o. female with medical history significant of Parkinson's dementia, hyperlipidemia was brought into the emergency department by her family due to significant weakness which has been going on since last few months but has significantly progressed since last 1 week.  Patient has no other complaints such as chest pain, shortness of breath, palpitation, fever, chills, sweating, nausea, vomiting, dizziness, headache, any problem with urination however she has intermittent constipation.  According to the family, patient has been taking 3 tablets of calcium supplement every day since about last 3 years when she broke her back and she was told by her PCP to supplement herself with calcium.  ED Course: Upon arrival to ED, patient was significantly hypertensive and her systolic blood pressure jumped up to 211 and diastolic up to 88.  She was severely hyperglycemic with calcium more than 15.  Initially, it was presumed that patient was having STEMI.  Cardiology was consulted and they reviewed patient's EKG and basically ruled out STEMI.  ED physician also discussed with nephrology who recommended PTH related peptide and PTH and recommended continuing IV fluid for now.  Patient refused calcitriol.  Hospitalist were consulted for admission.  Review of Systems: As per HPI otherwise negative.    History reviewed. No pertinent past medical history.  Past Surgical History:  Procedure Laterality Date   COLONOSCOPY N/A 01/09/2014   Procedure: COLONOSCOPY;  Surgeon: Dalia Heading, MD;  Location: [facility] ENDO SUITE;  Service: Gastroenterology;  Laterality: N/A;  8:30     has no history on file for tobacco use, alcohol use, and  drug use.  No Known Allergies  History reviewed. No pertinent family history.  Prior to Admission medications   Medication Sig Start Date End Date Taking? Authorizing Provider  amantadine (SYMMETREL) 100 MG capsule Take by mouth. 10/16/21   [provider]  aspirin EC 81 MG tablet Take 40.5 mg by mouth 2 (two) times daily.    [provider]  ezetimibe (ZETIA) 10 MG tablet Take 10 mg by mouth daily. 09/22/21   [provider]  gabapentin (NEURONTIN) 100 MG capsule Take 100 mg by mouth 3 (three) times daily. 05/26/21   [provider]  Multiple Vitamins-Minerals (MULTIVITAMINS THER. W/MINERALS) TABS tablet Take 1 tablet by mouth daily.    [provider]    Physical Exam: Vitals:   10/25/2021 1330 11/07/2021 1400 10/21/2021 1430 10/19/2021 1500  BP: (!) 197/75 (!) 206/88 (!) 165/63 (!) 180/77  Pulse: 76 79 73 67  Resp: (!) 21 18 19 17   Temp:      TempSrc:      SpO2: 99% 96% 99% 93%  Weight:      Height:        Constitutional: NAD, calm, comfortable Vitals:   10/28/2021 1330 10/10/2021 1400 10/14/2021 1430 10/19/2021 1500  BP: (!) 197/75 (!) 206/88 (!) 165/63 (!) 180/77  Pulse: 76 79 73 67  Resp: (!) 21 18 19 17   Temp:      TempSrc:      SpO2: 99% 96% 99% 93%  Weight:      Height:       Eyes: PERRL, lids and conjunctivae normal ENMT: Mucous membranes are moist. Posterior pharynx clear of  any exudate or lesions.Normal dentition.  Neck: normal, supple, no masses, no thyromegaly Respiratory: clear to auscultation bilaterally, no wheezing, no crackles. Normal respiratory effort. No accessory muscle use.  Cardiovascular: Regular rate and rhythm, no murmurs / rubs / gallops. No extremity edema. 2+ pedal pulses. No carotid bruits.  Abdomen: no tenderness, no masses palpated. No hepatosplenomegaly. Bowel sounds positive.  Musculoskeletal: no clubbing / cyanosis. No joint deformity upper and lower extremities. Good ROM, no contractures. Normal muscle tone.   Skin: no rashes, lesions, ulcers. No induration Neurologic: CN 2-12 grossly intact. Sensation intact, DTR normal. Strength 5/5 in all 4.  Psychiatric: Normal judgment and insight. Alert and oriented x 3. Normal mood.    Labs on Admission: I have personally reviewed following labs and imaging studies  CBC: Recent Labs  Lab October 28, 2021 1159  WBC 9.6  9.3  HGB 13.8  14.0  HCT 41.8  41.9  MCV 94.1  93.5  PLT 226  215   Basic Metabolic Panel: Recent Labs  Lab 28-Oct-2021 1159  NA 136  137  K 3.7  3.8  CL 96*  97*  CO2 31  32  GLUCOSE 147*  149*  BUN 23  24*  CREATININE 1.06*  1.05*  CALCIUM >15.0*  >15.0*   GFR: Estimated Creatinine Clearance: 33.8 mL/min (A) (by C-G formula based on SCr of 1.05 mg/dL (H)). Liver Function Tests: Recent Labs  Lab 2021-10-28 1159  AST 102*  ALT 165*  ALKPHOS 204*  BILITOT 1.1  PROT 7.0  ALBUMIN 3.3*   Recent Labs  Lab 2021/10/28 1159  LIPASE 24   No results for input(s): "AMMONIA" in the last 168 hours. Coagulation Profile: No results for input(s): "INR", "PROTIME" in the last 168 hours. Cardiac Enzymes: No results for input(s): "CKTOTAL", "CKMB", "CKMBINDEX", "TROPONINI" in the last 168 hours. BNP (last 3 results) No results for input(s): "PROBNP" in the last 8760 hours. HbA1C: No results for input(s): "HGBA1C" in the last 72 hours. CBG: Recent Labs  Lab 10-28-2021 1221  GLUCAP 119*   Lipid Profile: No results for input(s): "CHOL", "HDL", "LDLCALC", "TRIG", "CHOLHDL", "LDLDIRECT" in the last 72 hours. Thyroid Function Tests: No results for input(s): "TSH", "T4TOTAL", "FREET4", "T3FREE", "THYROIDAB" in the last 72 hours. Anemia Panel: No results for input(s): "VITAMINB12", "FOLATE", "FERRITIN", "TIBC", "IRON", "RETICCTPCT" in the last 72 hours. Urine analysis: No results found for: "COLORURINE", "APPEARANCEUR", "LABSPEC", "PHURINE", "GLUCOSEU", "HGBUR", "BILIRUBINUR", "KETONESUR", "PROTEINUR", "UROBILINOGEN",  "NITRITE", "LEUKOCYTESUR"  Radiological Exams on Admission: CT HEAD WO CONTRAST ( )  Result Date: 2021/10/28 CLINICAL DATA:  Head trauma, minor (Age >= 65y) EXAM: CT HEAD WITHOUT CONTRAST TECHNIQUE: Contiguous axial images were obtained from the base of the skull through the vertex without intravenous contrast. RADIATION DOSE REDUCTION: This exam was performed according to the departmental dose-optimization program which includes automated exposure control, adjustment of the mA and/or kV according to patient size and/or use of iterative reconstruction technique. COMPARISON:  None Available. FINDINGS: Brain: No evidence of acute infarction, acute hemorrhage, hydrocephalus, extra-axial collection or abnormal mass effect. Area of dystrophic calcification in the right frontoparietal region. Vascular: Calcific atherosclerosis Skull: No acute fracture. Sinuses/Orbits: Clear visualized sinuses. No acute orbital findings. Other: No mastoid effusions IMPRESSION: 1. No evidence of acute intracranial abnormality. 2. Area of dystrophic calcification in the right frontoparietal region, probably the sequela of prior nonspecific insult versus low-flow vascular malformation. Electronically Signed   By: Feliberto Harts M.D.   On: 10/28/21 13:13    EKG: Independently reviewed.  Sinus  rhythm with first-degree AV block, right bundle branch block and LAFB.  Assessment/Plan Principal Problem:   Hypercalcemia Active Problems:   Hypertensive urgency   Parkinson's disease dementia (HCC)   Elevated troponin   Generalized weakness   Dysphagia   Hypercalcemia, iatrogenic: Likely secondary to overdose of calcium supplementation.  At this point in time we will start her on IV fluids at 125 cc/h and monitor calcium and hopefully this will resolve.  Hypertensive urgency: Patient is not on any medications for hypertension PTA.  She has received clonidine and labetalol.  I will start her on as needed hydralazine for now.   I will also start her on amlodipine 5 mg p.o. daily starting now.  Generalized weakness: This is likely secondary to calcium.  Hopefully we will see improvement in her overall strength once calcium improves, we will consult PT OT.  Parkinson's disease: Patient is on amantadine which I will continue.  Elevated troponin: Elevated but flat and patient has no ACS complaints.  EKG reviewed by cardiology.  We will follow 1 more cardiac enzymes and check transthoracic echo.  Dysphagia: Per nurses, patient had passed bedside swallow earlier but then started coughing and was noted to have some swallowing difficulty, we will keep her n.p.o. to prevent aspiration pneumonia at the moment and consult SLP.  Elevated troponin: Source unknown, likely demand ischemia.  Check transthoracic echo.  AKI vs CKD stage IIIa: Do not know her baseline yet.  We will monitor closely.  DVT prophylaxis: enoxaparin (LOVENOX) injection 40 mg Start: 11/02/2021 1600 Code Status: DNR Family Communication: Son and has been present at bedside.  Plan of care discussed with patient in length and he verbalized understanding and agreed with it. Disposition Plan: Admission to the hospital and likely home in 2 to 3 days. Consults called: Cardiology and nephrology curb sided.  Hughie Closs MD [facility]  *Please note that this is a verbal dictation therefore any spelling or grammatical errors are due to the "[employer]" system interpretation.  Please page via Amion and do not message via secure chat for urgent patient care matters. Secure chat can be used for non urgent patient care matters. 11/08/2021, 3:48 PM  To contact the attending provider between 7A-7P or the covering provider during after hours 7P-7A, please log into the web site [email or web address]
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
## 2021-10-20 NOTE — ED Provider Notes (Signed)
[facility] EMERGENCY DEPARTMENT Provider Note   CSN: [id number] Arrival date & time: 10/31/2021  1101     History Chief Complaint  Patient presents with   Weakness    HPI Shelby [name] is a 87 y.o. female presenting for diffuse weakness.  She presents with her husband and son who provides much of the history.  She is an otherwise healthy 87 year old female with a minimal medical history.  She was recently diagnosed with Parkinson's disease. Over the last 3 days she had acute weakening and altered mental status episodes.  They deny fevers or chills nausea vomiting syncope shortness of breath.  Patient's recorded medical, surgical, social, medication list and allergies were reviewed in the Snapshot window as part of the initial history.   Review of Systems   Review of Systems  Constitutional:  Negative for chills and fever.  HENT:  Negative for ear pain and sore throat.   Eyes:  Negative for pain and visual disturbance.  Respiratory:  Negative for cough and shortness of breath.   Cardiovascular:  Negative for chest pain and palpitations.  Gastrointestinal:  Negative for abdominal pain and vomiting.  Genitourinary:  Negative for dysuria and hematuria.  Musculoskeletal:  Negative for arthralgias and back pain.  Skin:  Negative for color change and rash.  Neurological:  Positive for weakness. Negative for seizures and syncope.  Psychiatric/Behavioral:  Positive for confusion.   All other systems reviewed and are negative.   Physical Exam Updated Vital Signs BP (!) 180/77   Pulse 67   Temp 97.6 F (36.4 C) (Oral)   Resp 17   Ht 5\' 4"  (1.626 m)   Wt 63.5 kg   SpO2 93%   BMI 24.03 kg/m  Physical Exam Vitals and nursing note reviewed.  Constitutional:      General: She is not in acute distress.    Appearance: She is well-developed.  HENT:     Head: Normocephalic and atraumatic.  Eyes:     Conjunctiva/sclera: Conjunctivae normal.  Cardiovascular:     Rate and Rhythm:  Normal rate and regular rhythm.     Heart sounds: No murmur heard. Pulmonary:     Effort: Pulmonary effort is normal. No respiratory distress.     Breath sounds: Normal breath sounds.  Abdominal:     General: There is no distension.     Palpations: Abdomen is soft.     Tenderness: There is no abdominal tenderness. There is no right CVA tenderness or left CVA tenderness.  Musculoskeletal:        General: No swelling or tenderness. Normal range of motion.     Cervical back: Neck supple.  Skin:    General: Skin is warm and dry.  Neurological:     General: No focal deficit present.     Mental Status: She is alert and oriented to person, place, and time. Mental status is at baseline.     Cranial Nerves: No cranial nerve deficit.      ED Course/ Medical Decision Making/ A&P Clinical Course as of 11/01/2021 1621  Mon Oct 20, 2021  1237 Given EKG findings, called code STEMI and talked with cardiology who wanted Troponins.  [CC]    Clinical Course User Index [CC] Tretha Sciara, MD    Procedures .Critical Care  Performed by: Tretha Sciara, MD Authorized by: Tretha Sciara, MD   Critical care provider statement:    Critical care time (minutes):  30   Critical care was time spent personally by me  on the following activities:  Development of treatment plan with patient or surrogate, discussions with consultants, evaluation of patient's response to treatment, examination of patient, ordering and review of laboratory studies, ordering and review of radiographic studies, ordering and performing treatments and interventions, pulse oximetry, re-evaluation of patient's condition and review of old [place] discussed with: admitting provider      Medications Ordered in ED Medications  ezetimibe (ZETIA) tablet 10 mg (10 mg Oral Not Given 10/18/2021 1548)  amantadine (SYMMETREL) capsule 100 mg (100 mg Oral Not Given 10/31/2021 1548)  enoxaparin (LOVENOX) injection 40 mg (has no  administration in time range)  0.9 %  sodium chloride infusion (has no administration in time range)  acetaminophen (TYLENOL) tablet 650 mg (has no administration in time range)    Or  acetaminophen (TYLENOL) suppository 650 mg (has no administration in time range)  ondansetron (ZOFRAN) tablet 4 mg (has no administration in time range)    Or  ondansetron (ZOFRAN) injection 4 mg (has no administration in time range)  hydrALAZINE (APRESOLINE) injection 20 mg (has no administration in time range)  amLODipine (NORVASC) tablet 5 mg (has no administration in time range)  aspirin EC tablet 81 mg (has no administration in time range)  lactated ringers bolus 1,000 mL (0 mLs Intravenous Stopped 10/13/2021 1520)  cloNIDine (CATAPRES) tablet 0.1 mg (0.1 mg Oral Given 11/01/2021 1342)  labetalol (NORMODYNE) injection 5 mg (5 mg Intravenous Given 10/10/2021 1427)  cloNIDine (CATAPRES) tablet 0.1 mg (0.1 mg Oral Given 11/07/2021 1541)    Medical Decision Making:    Shelby [name] is a 87 y.o. female who presented to the ED today with altered mental status and gross weakness detailed above.     Patient's presentation is complicated by their history of multiple comorbid medical conditions including advanced age and Parkinson's disease.  Patient placed on continuous vitals and telemetry monitoring while in ED which was reviewed periodically.   Complete initial physical exam performed, notably the patient  was hemodynamically stable in no acute distress..      Reviewed and confirmed nursing documentation for past medical history, family history, social history.    Initial Assessment:   With the patient's presentation of ongoing weakness, most likely diagnosis is metabolic disruption versus anticipated clinical course in the setting of Parkinson's disease. Other diagnoses were considered including (but not limited to) urinary tract infection, viral upper respiratory infection, clinical dehydration, CVA. These are  considered less likely due to history of present illness and physical exam findings.   This is most consistent with an acute life/limb threatening illness complicated by underlying chronic conditions.  Initial Plan:  CT head to evaluate for structural intracranial etiology as the cause of patient's altered mental status Screening labs including CBC and Metabolic panel to evaluate for infectious or metabolic etiology of disease.  Urinalysis with reflex culture ordered to evaluate for UTI or relevant urologic pathology Troponin and EKG to evaluate for cardiac pathology. Objective evaluation as below reviewed with plan for close reassessment  Initial Study Results:   Laboratory  Patient is found to have critically high calcium Patient is found to have elevated troponin EKG Patient's initial EKG reviewed with concern for ST elevation myocardial infarction.  Code STEMI called through physician access line.  Discussed case with cardiology on-call.  They felt that activation of Cath Lab was unnecessary at this time and patient got up delta troponins and treatment of her hypertension. Repeat EKG was done after initial troponin and  demonstrated no acute changes.   Radiology  All images reviewed independently. Agree with radiology report at this time.   CT HEAD WO CONTRAST ( )  Result Date: 10-28-21 CLINICAL DATA:  Head trauma, minor (Age >= 65y) EXAM: CT HEAD WITHOUT CONTRAST TECHNIQUE: Contiguous axial images were obtained from the base of the skull through the vertex without intravenous contrast. RADIATION DOSE REDUCTION: This exam was performed according to the departmental dose-optimization program which includes automated exposure control, adjustment of the mA and/or kV according to patient size and/or use of iterative reconstruction technique. COMPARISON:  None Available. FINDINGS: Brain: No evidence of acute infarction, acute hemorrhage, hydrocephalus, extra-axial collection or abnormal mass  effect. Area of dystrophic calcification in the right frontoparietal region. Vascular: Calcific atherosclerosis Skull: No acute fracture. Sinuses/Orbits: Clear visualized sinuses. No acute orbital findings. Other: No mastoid effusions IMPRESSION: 1. No evidence of acute intracranial abnormality. 2. Area of dystrophic calcification in the right frontoparietal region, probably the sequela of prior nonspecific insult versus low-flow vascular malformation. Electronically Signed   By: Feliberto Harts M.D.   On: 10-28-21 13:13     Consults:  Case discussed with on-call cardiology, STEMI line, on-call nephrology.  On-call cardiology felt that patient's case represents hypertensive emergency rather than acute cardiac etiology On-call nephrology made recommendations regarding further evaluation for hypercalcemia including parathyroid hormone checking, parathyroid like peptide checking, vitamin D level checking and IV fluids with plan to trend calcium. Final Assessment and Plan:   Patient symptoms are likely related to her hypercalcemia.  Patient treated with IV fluids without any acute change in symptoms.  Discussed case with hospitalist who excepted admission.  Patient required close reassessment in the emergency department frequently to ensure no acute decompensation.  Disposition:   Based on the above findings, I believe this patient is stable for admission.    Patient/family educated about specific findings on our evaluation and explained exact reasons for admission.  Patient/family educated about clinical situation and time was allowed to answer questions.   Admission team communicated with and agreed with need for admission. Patient admitted. Patient ready to move at this time.     Emergency Department Medication Summary:   Medications  ezetimibe (ZETIA) tablet 10 mg (10 mg Oral Not Given 10/28/21 1548)  amantadine (SYMMETREL) capsule 100 mg (100 mg Oral Not Given 10-28-21 1548)  enoxaparin  (LOVENOX) injection 40 mg (has no administration in time range)  0.9 %  sodium chloride infusion (has no administration in time range)  acetaminophen (TYLENOL) tablet 650 mg (has no administration in time range)    Or  acetaminophen (TYLENOL) suppository 650 mg (has no administration in time range)  ondansetron (ZOFRAN) tablet 4 mg (has no administration in time range)    Or  ondansetron (ZOFRAN) injection 4 mg (has no administration in time range)  hydrALAZINE (APRESOLINE) injection 20 mg (has no administration in time range)  amLODipine (NORVASC) tablet 5 mg (has no administration in time range)  aspirin EC tablet 81 mg (has no administration in time range)  lactated ringers bolus 1,000 mL (0 mLs Intravenous Stopped 10-28-2021 1520)  cloNIDine (CATAPRES) tablet 0.1 mg (0.1 mg Oral Given October 28, 2021 1342)  labetalol (NORMODYNE) injection 5 mg (5 mg Intravenous Given 10-28-21 1427)  cloNIDine (CATAPRES) tablet 0.1 mg (0.1 mg Oral Given Oct 28, 2021 1541)        Emergency Department Medication Summary:   Medications  ezetimibe (ZETIA) tablet 10 mg (10 mg Oral Not Given 28-Oct-2021 1548)  amantadine (SYMMETREL) capsule 100 mg (  100 mg Oral Not Given 11/04/2021 1548)  enoxaparin (LOVENOX) injection 40 mg (has no administration in time range)  0.9 %  sodium chloride infusion (has no administration in time range)  acetaminophen (TYLENOL) tablet 650 mg (has no administration in time range)    Or  acetaminophen (TYLENOL) suppository 650 mg (has no administration in time range)  ondansetron (ZOFRAN) tablet 4 mg (has no administration in time range)    Or  ondansetron (ZOFRAN) injection 4 mg (has no administration in time range)  hydrALAZINE (APRESOLINE) injection 20 mg (has no administration in time range)  amLODipine (NORVASC) tablet 5 mg (has no administration in time range)  aspirin EC tablet 81 mg (has no administration in time range)  lactated ringers bolus 1,000 mL (0 mLs Intravenous Stopped 11/07/2021  1520)  cloNIDine (CATAPRES) tablet 0.1 mg (0.1 mg Oral Given 10/17/2021 1342)  labetalol (NORMODYNE) injection 5 mg (5 mg Intravenous Given 11/08/2021 1427)  cloNIDine (CATAPRES) tablet 0.1 mg (0.1 mg Oral Given 10/19/2021 1541)     Clinical Impression:  1. Generalized weakness   2. Hypercalcemia      Admit   Final Clinical Impression(s) / ED Diagnoses Final diagnoses:  Generalized weakness  Hypercalcemia    Rx / DC Orders ED Discharge Orders     None         Glyn Ade, MD 10/25/2021 1621

## 2021-10-20 NOTE — Progress Notes (Signed)
[facility]  STEMI Consultation Note  I was contacted by Dr. Doran Durand to review the patient's EKG's for possible STEMI.  She presented to [facility] ED with 3 days of weakness.  She also reports mild shortness of breath but no chest pain.  EKG's were personally reviewed and show sinus rhythm with first degree AV block, RBBB, and LAFB (trifascicular block).  There are some nonspecific ST/T changes, but the tracings do not meet STEMI criteria.  No prior tracing is available for comparison.  I do not recommend moving forward with emergent cardiac catheterization at this time.  We have agreed to continue further evaluation at [facility] per Dr. Doran Durand and the ED team.  Yvonne Kendall, MD [facility] November 19, 2021 12:37 PM

## 2021-10-20 NOTE — ED Notes (Signed)
Called [facility] CODE STEMI.  Beeped to [phone].

## 2021-10-20 NOTE — ED Notes (Signed)
MD stated no emergency traffic right now, MD wants to speak to cardiology first; awaiting orders.

## 2021-10-20 NOTE — ED Triage Notes (Signed)
Pt arrived by EMS. Husband called out for weakness that has became worse over the last two days. Pt noted to have left sided facial droop and right sided weakness. The husband does not know when the patient was last normal. Pt is A/O x4.

## 2021-10-20 NOTE — ED Notes (Signed)
Patient has had no urinary output thus far this shift. Bladder scanned, 880 ml noted in bladder. Hospitalist made aware, awaiting orders at this time.

## 2021-10-21 ENCOUNTER — Observation Stay (HOSPITAL_COMMUNITY): Payer: Medicare HMO

## 2021-10-21 DIAGNOSIS — Z7189 Other specified counseling: Secondary | ICD-10-CM | POA: Diagnosis not present

## 2021-10-21 DIAGNOSIS — R778 Other specified abnormalities of plasma proteins: Secondary | ICD-10-CM

## 2021-10-21 DIAGNOSIS — G20 Parkinson's disease: Secondary | ICD-10-CM | POA: Diagnosis present

## 2021-10-21 DIAGNOSIS — J9601 Acute respiratory failure with hypoxia: Secondary | ICD-10-CM | POA: Diagnosis not present

## 2021-10-21 DIAGNOSIS — I160 Hypertensive urgency: Secondary | ICD-10-CM | POA: Diagnosis present

## 2021-10-21 DIAGNOSIS — I161 Hypertensive emergency: Secondary | ICD-10-CM | POA: Diagnosis present

## 2021-10-21 DIAGNOSIS — I10 Essential (primary) hypertension: Secondary | ICD-10-CM | POA: Diagnosis present

## 2021-10-21 DIAGNOSIS — R1310 Dysphagia, unspecified: Secondary | ICD-10-CM | POA: Diagnosis present

## 2021-10-21 DIAGNOSIS — G9341 Metabolic encephalopathy: Secondary | ICD-10-CM | POA: Diagnosis present

## 2021-10-21 DIAGNOSIS — I248 Other forms of acute ischemic heart disease: Secondary | ICD-10-CM | POA: Diagnosis present

## 2021-10-21 DIAGNOSIS — Z66 Do not resuscitate: Secondary | ICD-10-CM | POA: Diagnosis present

## 2021-10-21 DIAGNOSIS — E872 Acidosis, unspecified: Secondary | ICD-10-CM | POA: Diagnosis present

## 2021-10-21 DIAGNOSIS — R739 Hyperglycemia, unspecified: Secondary | ICD-10-CM | POA: Diagnosis present

## 2021-10-21 DIAGNOSIS — E673 Hypervitaminosis D: Secondary | ICD-10-CM | POA: Diagnosis present

## 2021-10-21 DIAGNOSIS — I453 Trifascicular block: Secondary | ICD-10-CM | POA: Diagnosis present

## 2021-10-21 DIAGNOSIS — I252 Old myocardial infarction: Secondary | ICD-10-CM | POA: Diagnosis not present

## 2021-10-21 DIAGNOSIS — T452X1A Poisoning by vitamins, accidental (unintentional), initial encounter: Secondary | ICD-10-CM | POA: Diagnosis present

## 2021-10-21 DIAGNOSIS — F0280 Dementia in other diseases classified elsewhere without behavioral disturbance: Secondary | ICD-10-CM | POA: Diagnosis present

## 2021-10-21 DIAGNOSIS — R531 Weakness: Secondary | ICD-10-CM | POA: Diagnosis not present

## 2021-10-21 DIAGNOSIS — Z515 Encounter for palliative care: Secondary | ICD-10-CM | POA: Diagnosis not present

## 2021-10-21 DIAGNOSIS — E785 Hyperlipidemia, unspecified: Secondary | ICD-10-CM | POA: Diagnosis present

## 2021-10-21 DIAGNOSIS — E876 Hypokalemia: Secondary | ICD-10-CM | POA: Diagnosis present

## 2021-10-21 DIAGNOSIS — R339 Retention of urine, unspecified: Secondary | ICD-10-CM | POA: Diagnosis not present

## 2021-10-21 DIAGNOSIS — E8809 Other disorders of plasma-protein metabolism, not elsewhere classified: Secondary | ICD-10-CM | POA: Diagnosis present

## 2021-10-21 DIAGNOSIS — I469 Cardiac arrest, cause unspecified: Secondary | ICD-10-CM | POA: Diagnosis not present

## 2021-10-21 LAB — ECHOCARDIOGRAM COMPLETE
AR max vel: 2.82 cm2
AV Area VTI: 2.7 cm2
AV Area mean vel: 2.78 cm2
AV Mean grad: 4 mmHg
AV Peak grad: 9.7 mmHg
Ao pk vel: 1.56 m/s
Area-P 1/2: 5.13 cm2
Height: 64 in
MV VTI: 3.63 cm2
P 1/2 time: 638 msec
S' Lateral: 2.9 cm
Weight: 1798.95 oz

## 2021-10-21 LAB — CBC WITH DIFFERENTIAL/PLATELET
Abs Immature Granulocytes: 0.04 10*3/uL (ref 0.00–0.07)
Basophils Absolute: 0 10*3/uL (ref 0.0–0.1)
Basophils Relative: 0 %
Eosinophils Absolute: 0.1 10*3/uL (ref 0.0–0.5)
Eosinophils Relative: 1 %
HCT: 36.2 % (ref 36.0–46.0)
Hemoglobin: 11.9 g/dL — ABNORMAL LOW (ref 12.0–15.0)
Immature Granulocytes: 0 %
Lymphocytes Relative: 12 %
Lymphs Abs: 1.1 10*3/uL (ref 0.7–4.0)
MCH: 31.2 pg (ref 26.0–34.0)
MCHC: 32.9 g/dL (ref 30.0–36.0)
MCV: 95 fL (ref 80.0–100.0)
Monocytes Absolute: 1 10*3/uL (ref 0.1–1.0)
Monocytes Relative: 10 %
Neutro Abs: 7.2 10*3/uL (ref 1.7–7.7)
Neutrophils Relative %: 77 %
Platelets: 209 10*3/uL (ref 150–400)
RBC: 3.81 MIL/uL — ABNORMAL LOW (ref 3.87–5.11)
RDW: 13 % (ref 11.5–15.5)
WBC: 9.4 10*3/uL (ref 4.0–10.5)
nRBC: 0 % (ref 0.0–0.2)

## 2021-10-21 LAB — COMPREHENSIVE METABOLIC PANEL
ALT: 145 U/L — ABNORMAL HIGH (ref 0–44)
AST: 92 U/L — ABNORMAL HIGH (ref 15–41)
Albumin: 2.8 g/dL — ABNORMAL LOW (ref 3.5–5.0)
Alkaline Phosphatase: 156 U/L — ABNORMAL HIGH (ref 38–126)
Anion gap: 7 (ref 5–15)
BUN: 33 mg/dL — ABNORMAL HIGH (ref 8–23)
CO2: 29 mmol/L (ref 22–32)
Calcium: 13.8 mg/dL (ref 8.9–10.3)
Chloride: 103 mmol/L (ref 98–111)
Creatinine, Ser: 1.1 mg/dL — ABNORMAL HIGH (ref 0.44–1.00)
GFR, Estimated: 49 mL/min — ABNORMAL LOW (ref >60)
Glucose, Bld: 141 mg/dL — ABNORMAL HIGH (ref 70–99)
Potassium: 3.4 mmol/L — ABNORMAL LOW (ref 3.5–5.1)
Sodium: 139 mmol/L (ref 135–145)
Total Bilirubin: 0.7 mg/dL (ref 0.3–1.2)
Total Protein: 6.1 g/dL — ABNORMAL LOW (ref 6.5–8.1)

## 2021-10-21 LAB — PARATHYROID HORMONE, INTACT (NO CA): PTH: 5 pg/mL — ABNORMAL LOW (ref 15–65)

## 2021-10-21 LAB — URINALYSIS, ROUTINE W REFLEX MICROSCOPIC
Bilirubin Urine: NEGATIVE
Glucose, UA: NEGATIVE mg/dL
Hgb urine dipstick: NEGATIVE
Ketones, ur: NEGATIVE mg/dL
Leukocytes,Ua: NEGATIVE
Nitrite: NEGATIVE
Protein, ur: NEGATIVE mg/dL
Specific Gravity, Urine: 1.006 (ref 1.005–1.030)
pH: 7 (ref 5.0–8.0)

## 2021-10-21 LAB — MRSA NEXT GEN BY PCR, NASAL: MRSA by PCR Next Gen: NOT DETECTED

## 2021-10-21 MED ORDER — AMLODIPINE BESYLATE 5 MG PO TABS: 10.0000 mg | ORAL_TABLET | Freq: Every day | ORAL | Status: DC

## 2021-10-21 MED ORDER — SODIUM CHLORIDE 0.9 % IV SOLN
60.0000 mg | Freq: Once | INTRAVENOUS | Status: AC
  Administered 2021-10-21: 60 mg via INTRAVENOUS
  Filled 2021-10-21: qty 20

## 2021-10-21 MED ORDER — MAGNESIUM SULFATE 2 GM/50ML IV SOLN
2.0000 g | Freq: Once | INTRAVENOUS | Status: AC
  Administered 2021-10-21: 2 g via INTRAVENOUS
  Filled 2021-10-21: qty 50

## 2021-10-21 MED ORDER — FUROSEMIDE 10 MG/ML IJ SOLN
20.0000 mg | Freq: Two times a day (BID) | INTRAMUSCULAR | Status: DC
  Administered 2021-10-21 (×2): 20 mg via INTRAVENOUS
  Filled 2021-10-21 (×2): qty 2

## 2021-10-21 MED ORDER — LORAZEPAM 2 MG/ML IJ SOLN
0.5000 mg | Freq: Once | INTRAMUSCULAR | Status: AC
  Administered 2021-10-21: 0.5 mg via INTRAVENOUS
  Filled 2021-10-21: qty 1

## 2021-10-21 MED ORDER — LORAZEPAM 2 MG/ML IJ SOLN
1.0000 mg | Freq: Once | INTRAMUSCULAR | Status: AC
  Administered 2021-10-21: 1 mg via INTRAVENOUS
  Filled 2021-10-21: qty 1

## 2021-10-21 NOTE — Progress Notes (Signed)
OT Cancellation Note  Patient Details Name: Shelby [name] MRN: [id number] DOB: 01-30-1935   Cancelled Treatment:    Reason Eval/Treat Not Completed: Fatigue/lethargy limiting ability to participate. Pt was given Ativan prior to therapy and was asleep. Nurse reported inability to wake pt. Pt's history obtained from husband. Will attempt evaluation later as time permits.   Chaden Doom OT, MOT   Danie Chandler 10/21/2021, 10:44 AM

## 2021-10-21 NOTE — Progress Notes (Signed)
PT Cancellation Note  Patient Details Name: Shelby [name] MRN: [id number] DOB: August 28, 1934   Cancelled Treatment:    Reason Eval/Treat Not Completed: Fatigue/lethargy limiting ability to participate.  Patient lethargic after sedative for procedure per RN - will check back tomorrow AM.   2:43 PM, 10/21/21 Ocie Bob, MPT Physical Therapist with [facility] [phone] office [phone] mobile phone

## 2021-10-21 NOTE — Consult Note (Signed)
[facility] Renal Consultation Note  Requesting MD: Emokpae Indication for Consultation: hypercalcemia   HPI:  Shelby [name] is a 87 y.o. female with past medical history of recently diagnosed Parkinson's, hyperlipidemia and it seems some undiagnosed HTN-  no prior labs in the system.  She was brought to the hospital yesterday for weakness that had been going on for months but seemed to acutely worsen.  Events of note-  had been started on amantadine by neuro and recently doubled.  Labs showed a calcium of over 15, a decreased albumin, elevated troponin with OK EKG.  Also her BP had been high. She does have a history of exogenous calcium intake as well as vitamin D.  I had advised to check vitamin D, pth and pth like peptide and just hydrate overnight.  BP has stayed high-  req PRN meds, calcium down to 13.8 with hydration-  vitamin D level is high -  other labs pending - crt normal and urinalysis negative for abnormality.  She did have some urinary retention overnight s/p foley.  MS is not better according to the family   Creatinine, Ser  Date/Time Value Ref Range Status  10/21/2021 05:31 AM 1.10 (H) 0.44 - 1.00 mg/dL Final  51/76/1607 37:10 AM 1.05 (H) 0.44 - 1.00 mg/dL Final  62/69/4854 62:70 AM 1.06 (H) 0.44 - 1.00 mg/dL Final     PMHx:  History reviewed. No pertinent past medical history.  Past Surgical History:  Procedure Laterality Date   COLONOSCOPY N/A 01/09/2014   Procedure: COLONOSCOPY;  Surgeon: Dalia Heading, MD;  Location: [facility];  Service: Gastroenterology;  Laterality: N/A;  8:30    Family Hx: History reviewed. No pertinent family history.  Social History:  has no history on file for tobacco use, alcohol use, and drug use.  Allergies: No Known Allergies  Medications: Prior to Admission medications   Medication Sig Start Date End Date Taking? Authorizing Provider  amantadine (SYMMETREL) 100 MG capsule Take 100 mg by mouth 2 (two) times daily.  10/16/21  Yes [provider]  Ascorbic Acid (VITAMIN C) 1000 MG tablet Take 1,000 mg by mouth daily.   Yes [provider]  aspirin EC 81 MG tablet Take 81 mg by mouth daily.   Yes [provider]  Cranberry 250 MG TABS Take by mouth.   Yes [provider]  ezetimibe (ZETIA) 10 MG tablet Take 10 mg by mouth daily. 09/22/21  Yes [provider]  ibuprofen (ADVIL) 200 MG tablet Take 200 mg by mouth every 6 (six) hours as needed.   Yes [provider]  Multiple Vitamins-Minerals (MULTIVITAMINS THER. W/MINERALS) TABS tablet Take 1 tablet by mouth daily.   Yes [provider]  Omega-3 Fatty Acids (FISH OIL PO) Take 1,000 mg by mouth daily.   Yes [provider]  OVER THE COUNTER MEDICATION Vitamin A, B12, D3   Yes [provider]  OVER THE COUNTER MEDICATION    Yes [provider]  zinc gluconate 50 MG tablet Take 50 mg by mouth daily.   Yes [provider]  gabapentin (NEURONTIN) 100 MG capsule Take 100 mg by mouth 3 (three) times daily. Patient not taking: Reported on 10/19/2021 05/26/21   [provider]    I have reviewed the patient's current medications.  Labs:  Results for orders placed or performed during the hospital encounter of 10/24/2021 (from the past 48 hour(s))  Basic metabolic panel     Status: Abnormal  Collection Time: Nov 13, 2021 11:59 AM  Result Value Ref Range   Sodium 137 135 - 145 mmol/L   Potassium 3.8 3.5 - 5.1 mmol/L   Chloride 97 (L) 98 - 111 mmol/L   CO2 32 22 - 32 mmol/L   Glucose, Bld 149 (H) 70 - 99 mg/dL    Comment: Glucose reference range applies only to samples taken after fasting for at least 8 hours.   BUN 24 (H) 8 - 23 mg/dL   Creatinine, Ser 1.02 (H) 0.44 - 1.00 mg/dL   Calcium >58.5 (HH) 8.9 - 10.3 mg/dL    Comment: CRITICAL RESULT CALLED TO, READ BACK BY AND VERIFIED WITH: Annabell Howells RN [phone] K FORSYTH    GFR, Estimated 52 (L) >60 mL/min     Comment: (NOTE) Calculated using the CKD-EPI Creatinine Equation (2021)    Anion gap 8 5 - 15    Comment: Performed at [facility], [address] [place], [place] [address]  CBC     Status: None   Collection Time: 11/13/21 11:59 AM  Result Value Ref Range   WBC 9.3 4.0 - 10.5 K/uL   RBC 4.48 3.87 - 5.11 MIL/uL   Hemoglobin 14.0 12.0 - 15.0 g/dL   HCT 14.4 31.5 - 40.0 %   MCV 93.5 80.0 - 100.0 fL   MCH 31.3 26.0 - 34.0 pg   MCHC 33.4 30.0 - 36.0 g/dL   RDW 86.7 61.9 - 50.9 %   Platelets 215 150 - 400 K/uL   nRBC 0.0 0.0 - 0.2 %    Comment: Performed at [facility], [address] [place], [place] [address]  CBC     Status: None   Collection Time: 11-13-2021 11:59 AM  Result Value Ref Range   WBC 9.6 4.0 - 10.5 K/uL   RBC 4.44 3.87 - 5.11 MIL/uL   Hemoglobin 13.8 12.0 - 15.0 g/dL   HCT 24.5 80.9 - 98.3 %   MCV 94.1 80.0 - 100.0 fL   MCH 31.1 26.0 - 34.0 pg   MCHC 33.0 30.0 - 36.0 g/dL   RDW 38.2 50.5 - 39.7 %   Platelets 226 150 - 400 K/uL   nRBC 0.0 0.0 - 0.2 %    Comment: Performed at [facility], [address], [place], [place] [address]  Comprehensive metabolic panel     Status: Abnormal   Collection Time: November 13, 2021 11:59 AM  Result Value Ref Range   Sodium 136 135 - 145 mmol/L   Potassium 3.7 3.5 - 5.1 mmol/L   Chloride 96 (L) 98 - 111 mmol/L   CO2 31 22 - 32 mmol/L   Glucose, Bld 147 (H) 70 - 99 mg/dL    Comment: Glucose reference range applies only to samples taken after fasting for at least 8 hours.   BUN 23 8 - 23 mg/dL   Creatinine, Ser 9.37 (H) 0.44 - 1.00 mg/dL   Calcium >90.2 (HH) 8.9 - 10.3 mg/dL    Comment: CRITICAL RESULT CALLED TO, READ BACK BY AND VERIFIED WITH: Annabell Howells RN [phone] K FORSYTH    Total Protein 7.0 6.5 - 8.1 g/dL   Albumin 3.3 (L) 3.5 - 5.0 g/dL   AST 329 (H) 15 - 41 U/L   ALT 165 (H) 0 - 44 U/L   Alkaline Phosphatase 204 (H) 38 - 126 U/L   Total Bilirubin 1.1 0.3 - 1.2 mg/dL   GFR, Estimated 51 (L) >60 mL/min    Comment:  (NOTE) Calculated  using the CKD-EPI Creatinine Equation (2021)    Anion gap 9 5 - 15    Comment: Performed at [facility], [address] [place], [place] [address]  Lipase, blood     Status: None   Collection Time: 10/19/2021 11:59 AM  Result Value Ref Range   Lipase 24 11 - 51 U/L    Comment: Performed at [facility], [address] [place], [place] [address]  Troponin I (High Sensitivity)     Status: Abnormal   Collection Time: 11/02/2021 11:59 AM  Result Value Ref Range   Troponin I (High Sensitivity) 127 (HH) <18 ng/L    Comment: CRITICAL RESULT CALLED TO, READ BACK BY AND VERIFIED WITH: Annabell HowellsM CUGINO RN [id number] K FORSYTH (NOTE) Elevated high sensitivity troponin I (hsTnI) values and significant  changes across serial measurements may suggest ACS but many other  chronic and acute conditions are known to elevate hsTnI results.  Refer to the Links section for chest pain algorithms and additional  guidance. Performed at [facility], [address] [place], [place] [address]   CBG monitoring, ED     Status: Abnormal   Collection Time: 10/21/2021 12:21 PM  Result Value Ref Range   Glucose-Capillary 119 (H) 70 - 99 mg/dL    Comment: Glucose reference range applies only to samples taken after fasting for at least 8 hours.  Troponin I (High Sensitivity)     Status: Abnormal   Collection Time: 10/10/2021  1:55 PM  Result Value Ref Range   Troponin I (High Sensitivity) 136 (HH) <18 ng/L    Comment: CRITICAL VALUE NOTED.  VALUE IS CONSISTENT WITH PREVIOUSLY REPORTED AND CALLED VALUE. (NOTE) Elevated high sensitivity troponin I (hsTnI) values and significant  changes across serial measurements may suggest ACS but many other  chronic and acute conditions are known to elevate hsTnI results.  Refer to the Links section for chest pain algorithms and additional  guidance. Performed at [facility], [address] [place], [place] [address]   TSH     Status: None   Collection Time: 11/07/2021   3:14 PM  Result Value Ref Range   TSH 0.865 0.350 - 4.500 uIU/mL    Comment: Performed by a 3rd Generation assay with a functional sensitivity of <=0.01 uIU/mL. Performed at [facility], [address] [place], [place] [address]   VITAMIN D 25 Hydroxy (Vit-D Deficiency, Fractures)     Status: Abnormal   Collection Time: 10/21/2021  3:18 PM  Result Value Ref Range   Vit D, 25-Hydroxy 109.68 (H) 30 - 100 ng/mL    Comment: (NOTE) Vitamin D deficiency has been defined by the Institute of Medicine  and an Endocrine Society practice guideline as a level of serum 25-OH  vitamin D less than 20 ng/mL (1,2). The Endocrine Society went on to  further define vitamin D insufficiency as a level between 21 and 29  ng/mL (2).  1. IOM (Institute of Medicine). 2010. Dietary reference intakes for  calcium and D. [place]: [employer] Academ[employer]. 2. Holick MF, Binkley [place], Bischoff-Ferrari HA, et al. Evaluation,  treatment, and prevention of vitamin D deficiency: an Endocrine  Society clinical practice guideline, JCEM. 2011 Jul; 96(7): 1911-30.  Performed at [facility], [address] [place], [place] [address]   Magnesium     Status: Abnormal   Collection Time: 10/20/21  3:45 PM  Result Value Ref Range   Magnesium 1.6 (L) 1.7 - 2.4 mg/dL  Comment: Performed at [facility], [address] [place], [place] [address]  MRSA Next Gen by PCR, Nasal     Status: None   Collection Time: 10/18/2021  6:50 PM   Specimen: Nasal Mucosa; Nasal Swab  Result Value Ref Range   MRSA by PCR Next Gen NOT DETECTED NOT DETECTED    Comment: (NOTE) The GeneXpert MRSA Assay (FDA approved for NASAL specimens only), is one component of a comprehensive MRSA colonization surveillance program. It is not intended to diagnose MRSA infection nor to guide or monitor treatment for MRSA infections. Test performance is not FDA approved in patients less than 12 years old. Performed at [facility], [address] [place], [place] [address]   Urinalysis, Routine w reflex microscopic     Status: Abnormal   Collection Time: 10/24/2021  9:38 PM  Result Value Ref Range   Color, Urine YELLOW YELLOW   APPearance CLOUDY (A) CLEAR   Specific Gravity, Urine 1.006 1.005 - 1.030   pH 7.0 5.0 - 8.0   Glucose, UA NEGATIVE NEGATIVE mg/dL   Hgb urine dipstick NEGATIVE NEGATIVE   Bilirubin Urine NEGATIVE NEGATIVE   Ketones, ur NEGATIVE NEGATIVE mg/dL   Protein, ur NEGATIVE NEGATIVE mg/dL   Nitrite NEGATIVE NEGATIVE   Leukocytes,Ua NEGATIVE NEGATIVE    Comment: Performed at [facility], [address], [place], [place] [address]  Comprehensive metabolic panel     Status: Abnormal   Collection Time: 10/21/21  5:31 AM  Result Value Ref Range   Sodium 139 135 - 145 mmol/L   Potassium 3.4 (L) 3.5 - 5.1 mmol/L   Chloride 103 98 - 111 mmol/L   CO2 29 22 - 32 mmol/L   Glucose, Bld 141 (H) 70 - 99 mg/dL    Comment: Glucose reference range applies only to samples taken after fasting for at least 8 hours.   BUN 33 (H) 8 - 23 mg/dL   Creatinine, Ser 6.62 (H) 0.44 - 1.00 mg/dL   Calcium 94.7 (HH) 8.9 - 10.3 mg/dL    Comment: CRITICAL RESULT CALLED TO, READ BACK BY AND VERIFIED WITH: FARO,J AT 6:30AM ON 10/21/21 BY FESTERMAN,C    Total Protein 6.1 (L) 6.5 - 8.1 g/dL   Albumin 2.8 (L) 3.5 - 5.0 g/dL   AST 92 (H) 15 - 41 U/L   ALT 145 (H) 0 - 44 U/L   Alkaline Phosphatase 156 (H) 38 - 126 U/L   Total Bilirubin 0.7 0.3 - 1.2 mg/dL   GFR, Estimated 49 (L) >60 mL/min    Comment: (NOTE) Calculated using the CKD-EPI Creatinine Equation (2021)    Anion gap 7 5 - 15    Comment: Performed at [facility], [address], [place], [place] [address]  CBC with Differential/Platelet     Status: Abnormal   Collection Time: 10/21/21  5:31 AM  Result Value Ref Range   WBC 9.4 4.0 - 10.5 K/uL   RBC 3.81 (L) 3.87 - 5.11 MIL/uL   Hemoglobin 11.9 (L) 12.0 - 15.0 g/dL   HCT 03.5 46.5 - 68.1 %   MCV 95.0 80.0 - 100.0 fL   MCH  31.2 26.0 - 34.0 pg   MCHC 32.9 30.0 - 36.0 g/dL   RDW 27.5 17.0 - 01.7 %   Platelets 209 150 - 400 K/uL   nRBC 0.0 0.0 - 0.2 %   Neutrophils Relative % 77 %   Neutro Abs 7.2 1.7 - 7.7 K/uL   Lymphocytes Relative 12 %  Lymphs Abs 1.1 0.7 - 4.0 K/uL   Monocytes Relative 10 %   Monocytes Absolute 1.0 0.1 - 1.0 K/uL   Eosinophils Relative 1 %   Eosinophils Absolute 0.1 0.0 - 0.5 K/uL   Basophils Relative 0 %   Basophils Absolute 0.0 0.0 - 0.1 K/uL   Immature Granulocytes 0 %   Abs Immature Granulocytes 0.04 0.00 - 0.07 K/uL    Comment: Performed at [facility], [address] [place], [place] [address]     ROS:  Review of systems not obtained due to patient factors.  Physical Exam: Vitals:   10/21/21 0500 10/21/21 0600  BP: (!) 157/58 (!) 143/90  Pulse: 92 93  Resp: 15 (!) 22  Temp:    SpO2: 97% 99%     General:  thin, mumbling-  family at bedside HEENT: PERRLA. EOMI, mucous membranes moist Neck: no JVD Heart: RRR Lungs: poor effort, mostly clear Abdomen: soft, non tender Extremities: minimal edema Skin: warm and dry Neuro: confused-  mumbling-  family says is new  Assessment/Plan: 87 year old WF with newly diagnosed Parkinson's now presenting with altered MS and hypercalcemia 1.Hypercalcemia-  dont have old labs to compare-  had been taking lots of calcium and vitamin D so hope that is all this is is excessive intake of exogenous calcium-  down a little overnight but MS not better.  Will intensify our efforts to bring it down-  add lasix onto IVF and also give one dose of pamidronate.  PTH and PTH related peptide pending-  vitamin D level is high  2. Hypertension/volume  - HTN which according to the family is new-  amantadine does not cause as far as I can see.  Giving IVF but will add lasix as well-  also has been started on norvasc and has PRNs ordered  3. MS - unclear if due to the Parkinsons, Amantadine, calcium-  has not improved yet-  per primary    Cecille Aver 10/21/2021, 7:50 AM

## 2021-10-21 NOTE — Progress Notes (Addendum)
PROGRESS NOTE     Shelby [name], is a 87 y.o. female, DOB - [birth year], PXT:[id number]  Admit date - 10/29/2021   Admitting Physician Hughie Closs, MD  Outpatient Primary MD for the patient is Elfredia Nevins, MD  LOS - 0  Chief Complaint  Patient presents with   Weakness        Brief Narrative:  87 y.o. female with medical history significant of Parkinson's dementia, hyperlipidemia admitted on 10/15/2021 with increasing confusion and found to have hypercalcemia in the setting of excessive calcium and vitamin D supplement ingestion over a long period of time prior to admission    -Assessment and Plan:   1)Hypercalcemia--- Likely secondary to excessive calcium and vitamin D intake -Hypoalbuminemia noted -Nephrology consult appreciated -Continue IV fluids/hydration -Patient received pamidronate IV-.   -PTH, PTH related peptide, TSH, vitamin D all pending -Patient with worsening mental status , no groans or moans, no hematuria  2)Hypertensive urgency: Apparently no prior history of hypertension, not on antihypertensives prior to admission -Patient was started on amlodipine..... Okay to titrate up amlodipine -May use IV hydralazine or IV labetalol as needed persistently elevated BP  3)Parkinson's disease/ambulatory disorder--- amantadine was recently increased to 2 tablets a day from 1 tablet daily -Family would like to go back to 1 tablet daily -Consider PT eval prior to discharge  4)Swallowing concerns--- speech eval requested   5)Elevated troponin: Elevated but flat and patient has no ACS complaints.   -EKG without ischemic findings -Echo with preserved EF of 60 to 35% no wall motion normalities patient does have grade 1 diastolic dysfunction with mild LVH   6)AKI vs CKD stage IIIa: -- -No prior baseline available -Hydrate - renally adjust medications, avoid nephrotoxic agents / dehydration  / hypotension  7) acute metabolic encephalopathy--- suspect due to  hypercalcemia as above #1... Cannot rule out component of AKI and dehydration -CT head without acute findings -Hydrate and manage calcium as above #1  8) hypokalemia--- replace  Disposition/Need for in-Hospital Stay- patient unable to be discharged at this time due to persistent hypercalcemia with metabolic encephalopathy/altered mentation despite IV fluids, now requiring IV pamidronate  -  Status is: Inpatient   Disposition: The patient is from: Home              Anticipated d/c is to: Home              Anticipated d/c date is: 2 days              Patient currently is not medically stable to d/c. Barriers: Not Clinically Stable-   Code Status :  -  Code Status: DNR   Family Communication:    Discussed with husband and son at bedside DVT Prophylaxis  :   - SCDs      Lab Results  Component Value Date   PLT 209 10/21/2021    Inpatient Medications  Scheduled Meds:  amantadine  100 mg Oral Daily   amLODipine  5 mg Oral Daily   aspirin EC  81 mg Oral Daily   Chlorhexidine Gluconate Cloth  6 each Topical Q0600   ezetimibe  10 mg Oral Daily   furosemide  20 mg Intravenous Q12H   Continuous Infusions:  sodium chloride 125 mL/hr at 10/21/21 1535   PRN Meds:.acetaminophen **OR** acetaminophen, hydrALAZINE, labetalol, ondansetron **OR** ondansetron (ZOFRAN) IV   Anti-infectives (From admission, onward)    None       Subjective: Shelby [name] today has no fevers, no emesis,  No chest pain,   -Remains confused...Shelby KitchenMarland [name]. disoriented -Husband and son at bedside -Successfully weaned off oxygen -Swallowing difficulties/concerns persist  Objective: Vitals:   10/21/21 1611 10/21/21 1618 10/21/21 1627 10/21/21 1630  BP: (!) 185/52   (!) 140/38  Pulse:    76  Resp:    (!) 22  Temp:  98 F (36.7 C)    TempSrc:  Axillary    SpO2:   97% 96%  Weight:      Height:        Intake/Output Summary (Last 24 hours) at 10/21/2021 1718 Last data filed at 10/21/2021 1535 Gross per 24  hour  Intake 1601.74 ml  Output 2450 ml  Net -848.26 ml   Filed Weights   04-Nov-2021 1137 10/21/21 0500  Weight: 63.5 kg 51 kg    Physical Exam  Gen:- Awake, confused and disoriented HEENT:- [place].AT, No sclera icterus Neck-Supple Neck,No JVD,.  Lungs-  CTAB , fair symmetrical air movement CV- S1, S2 normal, regular  Abd-  +ve B.Sounds, Abd Soft, No tenderness,    Extremity/Skin:- No  edema, pedal pulses present  Psych-confused and disoriented, , agitated at times  neuro-generalized weakness, no new focal deficits, Parkinson's tremors GU--Foley in situ.... To be removed on 10/21/2021  Data Reviewed: I have personally reviewed following labs and imaging studies  CBC: Recent Labs  Lab 11-04-2021 1159 10/21/21 0531  WBC 9.6  9.3 9.4  NEUTROABS  --  7.2  HGB 13.8  14.0 11.9*  HCT 41.8  41.9 36.2  MCV 94.1  93.5 95.0  PLT 226  215 209   Basic Metabolic Panel: Recent Labs  Lab 11-04-21 1159 2021/11/04 1545 10/21/21 0531  NA 136  137  --  139  K 3.7  3.8  --  3.4*  CL 96*  97*  --  103  CO2 31  32  --  29  GLUCOSE 147*  149*  --  141*  BUN 23  24*  --  33*  CREATININE 1.06*  1.05*  --  1.10*  CALCIUM >15.0*  >15.0*  --  13.8*  MG  --  1.6*  --    GFR: Estimated Creatinine Clearance: 30.1 mL/min (A) (by C-G formula based on SCr of 1.1 mg/dL (H)). Liver Function Tests: Recent Labs  Lab 11/04/21 1159 10/21/21 0531  AST 102* 92*  ALT 165* 145*  ALKPHOS 204* 156*  BILITOT 1.1 0.7  PROT 7.0 6.1*  ALBUMIN 3.3* 2.8*   Recent Results (from the past 240 hour(s))  MRSA Next Gen by PCR, Nasal     Status: None   Collection Time: 11-04-21  6:50 PM   Specimen: Nasal Mucosa; Nasal Swab  Result Value Ref Range Status   MRSA by PCR Next Gen NOT DETECTED NOT DETECTED Final    Comment: (NOTE) The GeneXpert MRSA Assay (FDA approved for NASAL specimens only), is one component of a comprehensive MRSA colonization surveillance program. It is not intended to diagnose  MRSA infection nor to guide or monitor treatment for MRSA infections. Test performance is not FDA approved in patients less than 73 years old. Performed at [facility], [address], [place], [place] [address]     Radiology Studies: ECHOCARDIOGRAM COMPLETE  Result Date: 10/21/2021    ECHOCARDIOGRAM REPORT   Patient Name:   KEAMBER MACFADDEN Skolnik Date of Exam: 10/21/2021 Medical Rec #:  [id number]       Height:       64.0 in Accession #:    [id number]  Weight:       112.4 lb Date of Birth:  1934/02/26       BSA:          1.532 m Patient Age:    87 years        BP:           143/90 mmHg Patient Gender: F               HR:           84 bpm. [facility] Procedure: 2D Echo, Cardiac Doppler and Color Doppler Indications:    Elevated Troponin  History:        Patient has no prior history of Echocardiogram examinations.                 Signs/Symptoms:Altered Mental Status; Risk Factors:Hypertension.                 Technically difficult study due to patient movement.  Sonographer:    Mikki Harbor Referring Phys: [id number] RAVI PAHWANI IMPRESSIONS  1. Left ventricular ejection fraction, by estimation, is 60 to 65%. The left ventricle has normal function. The left ventricle has no regional wall motion abnormalities. There is mild left ventricular hypertrophy. Left ventricular diastolic parameters are consistent with Grade I diastolic dysfunction (impaired relaxation).  2. Right ventricular systolic function is normal. The right ventricular size is normal. There is normal pulmonary artery systolic pressure. The estimated right ventricular systolic pressure is 13.6 mmHg.  3. The mitral valve is grossly normal. Trivial mitral valve regurgitation.  4. The aortic valve is tricuspid. Aortic valve regurgitation is mild. No aortic stenosis is present. Aortic regurgitation PHT measures 638 msec.  5. The inferior vena cava is normal in size with greater than 50% respiratory variability, suggesting right atrial  pressure of 3 mmHg. Comparison(s): No prior Echocardiogram. FINDINGS  Left Ventricle: Left ventricular ejection fraction, by estimation, is 60 to 65%. The left ventricle has normal function. The left ventricle has no regional wall motion abnormalities. The left ventricular internal cavity size was normal in size. There is  mild left ventricular hypertrophy. Left ventricular diastolic parameters are consistent with Grade I diastolic dysfunction (impaired relaxation). Right Ventricle: The right ventricular size is normal. No increase in right ventricular wall thickness. Right ventricular systolic function is normal. There is normal pulmonary artery systolic pressure. The tricuspid regurgitant velocity is 1.63 m/s, and  with an assumed right atrial pressure of 3 mmHg, the estimated right ventricular systolic pressure is 13.6 mmHg. Left Atrium: Left atrial size was normal in size. Right Atrium: Right atrial size was normal in size. Pericardium: Trivial pericardial effusion is present. The pericardial effusion is posterior to the left ventricle. Mitral Valve: The mitral valve is grossly normal. Trivial mitral valve regurgitation. MV peak gradient, 3.7 mmHg. The mean mitral valve gradient is 2.0 mmHg. Tricuspid Valve: The tricuspid valve is grossly normal. Tricuspid valve regurgitation is trivial. Aortic Valve: The aortic valve is tricuspid. Aortic valve regurgitation is mild. Aortic regurgitation PHT measures 638 msec. No aortic stenosis is present. Aortic valve mean gradient measures 4.0 mmHg. Aortic valve peak gradient measures 9.7 mmHg. Aortic  valve area, by VTI measures 2.70 cm. Pulmonic Valve: The pulmonic valve was grossly normal. Pulmonic valve regurgitation is mild. Aorta: The aortic root is normal in size and structure. Venous: The inferior vena cava is normal in size with greater than 50% respiratory variability, suggesting right atrial pressure of 3 mmHg. IAS/Shunts: No atrial  level shunt detected by color  flow Doppler.  LEFT VENTRICLE PLAX 2D LVIDd:         4.50 cm   Diastology LVIDs:         2.90 cm   LV e' medial:    6.74 cm/s LV PW:         1.10 cm   LV E/e' medial:  13.1 LV IVS:        1.00 cm   LV e' lateral:   6.53 cm/s LVOT diam:     2.00 cm   LV E/e' lateral: 13.5 LV SV:         92 LV SV Index:   60 LVOT Area:     3.14 cm  RIGHT VENTRICLE RV Basal diam:  2.95 cm RV Mid diam:    2.20 cm RV S prime:     11.50 cm/s TAPSE (M-mode): 2.7 cm LEFT ATRIUM             Index        RIGHT ATRIUM           Index LA diam:        3.30 cm 2.15 cm/m   RA Area:     17.90 cm LA Vol (A2C):   51.9 ml 33.89 ml/m  RA Volume:   47.60 ml  31.08 ml/m LA Vol (A4C):   44.5 ml 29.05 ml/m LA Biplane Vol: 48.3 ml 31.54 ml/m  AORTIC VALVE                    PULMONIC VALVE AV Area (Vmax):    2.82 cm     PV Vmax:       0.72 m/s AV Area (Vmean):   2.78 cm     PV Peak grad:  2.1 mmHg AV Area (VTI):     2.70 cm AV Vmax:           156.00 cm/s AV Vmean:          92.900 cm/s AV VTI:            0.342 m AV Peak Grad:      9.7 mmHg AV Mean Grad:      4.0 mmHg LVOT Vmax:         140.00 cm/s LVOT Vmean:        82.200 cm/s LVOT VTI:          0.294 m LVOT/AV VTI ratio: 0.86 AI PHT:            638 msec  AORTA Ao Root diam: 3.10 cm Ao Asc diam:  3.20 cm MITRAL VALVE                TRICUSPID VALVE MV Area (PHT): 5.13 cm     TR Peak grad:   10.6 mmHg MV Area VTI:   3.63 cm     TR Vmax:        163.00 cm/s MV Peak grad:  3.7 mmHg MV Mean grad:  2.0 mmHg     SHUNTS MV Vmax:       0.96 m/s     Systemic VTI:  0.29 m MV Vmean:      58.1 cm/s    Systemic Diam: 2.00 cm MV Decel Time: 148 msec MV E velocity: 88.30 cm/s MV A velocity: 105.00 cm/s MV E/A ratio:  0.84 Nona Dell MD Electronically signed by Nona Dell MD Signature Date/Time: 10/21/2021/11:59:16 AM    Final  CT HEAD WO CONTRAST (5MM)  Result Date: 09/11/2021 CLINICAL DATA:  Head trauma, minor (Age >= 65y) EXAM: CT HEAD WITHOUT CONTRAST TECHNIQUE: Contiguous axial images were  obtained from the base of the skull through the vertex without intravenous contrast. RADIATION DOSE REDUCTION: This exam was performed according to the departmental dose-optimization program which includes automated exposure control, adjustment of the mA and/or kV according to patient size and/or use of iterative reconstruction technique. COMPARISON:  None Available. FINDINGS: Brain: No evidence of acute infarction, acute hemorrhage, hydrocephalus, extra-axial collection or abnormal mass effect. Area of dystrophic calcification in the right frontoparietal region. Vascular: Calcific atherosclerosis Skull: No acute fracture. Sinuses/Orbits: Clear visualized sinuses. No acute orbital findings. Other: No mastoid effusions IMPRESSION: 1. No evidence of acute intracranial abnormality. 2. Area of dystrophic calcification in the right frontoparietal region, probably the sequela of prior nonspecific insult versus low-flow vascular malformation. Electronically Signed   By: Feliberto HartsFrederick S Jones M.D.   On: 008/04/2021 13:13     Scheduled Meds:  amantadine  100 mg Oral Daily   amLODipine  5 mg Oral Daily   aspirin EC  81 mg Oral Daily   Chlorhexidine Gluconate Cloth  6 each Topical Q0600   ezetimibe  10 mg Oral Daily   furosemide  20 mg Intravenous Q12H   Continuous Infusions:  sodium chloride 125 mL/hr at 10/21/21 1535     LOS: 0 days    Shon Haleourage Tziporah Knoke M.D on 10/21/2021 at 5:18 PM  Go to [email or web address] - for contact info  [facility] - Office  [phone]  If 7PM-7AM, please contact night-coverage [email or web address] 10/21/2021, 5:18 PM

## 2021-10-21 NOTE — Progress Notes (Signed)
*  PRELIMINARY RESULTS* Echocardiogram 2D Echocardiogram has been performed.  Shelby [name] 10/21/2021, 9:39 AM

## 2021-10-21 NOTE — TOC Progression Note (Signed)
  Transition of Care [facility]) Screening Note   Patient Details  Name: Shelby [name] Date of Birth: 12-01-1934   Transition of Care [facility]) CM/SW Contact:    Leitha Bleak, RN Phone Number: 10/21/2021, 9:53 AM    Transition of Care Department [facility]) has reviewed patient and no TOC needs have been identified at this time. We will continue to monitor patient advancement through interdisciplinary progression rounds. If new patient transition needs arise, please place a TOC consult.    Expected Discharge Plan: Home/Self Care    Expected Discharge Plan and Services Expected Discharge Plan: Home/Self Care       Living arrangements for the past 2 months: Single Family Home

## 2021-10-21 NOTE — Evaluation (Signed)
Clinical/Bedside Swallow Evaluation Patient Details  Name: Shelby [name] MRN: [id number] Date of Birth: Jan 28, 1935  Today's Date: 10/21/2021 Time: SLP Start Time (ACUTE ONLY): 1422 SLP Stop Time (ACUTE ONLY): 1451 SLP Time Calculation (min) (ACUTE ONLY): 29 min  Past Medical History: History reviewed. No pertinent past medical history. Past Surgical History:  Past Surgical History:  Procedure Laterality Date   COLONOSCOPY N/A 01/09/2014   Procedure: COLONOSCOPY;  Surgeon: Dalia Heading, MD;  Location: [facility];  Service: Gastroenterology;  Laterality: N/A;  8:30   HPI:  Shelby [name] is a 87 y.o. female with medical history significant of Parkinson's dementia, hyperlipidemia was brought into the emergency department by her family due to significant weakness which has been going on since last few months but has significantly progressed since last 1 week.  Patient has no other complaints such as chest pain, shortness of breath, palpitation, fever, chills, sweating, nausea, vomiting, dizziness, headache, any problem with urination however she has intermittent constipation.  According to the family, patient has been taking 3 tablets of calcium supplement every day since about last 3 years when she broke her back and she was told by her PCP to supplement herself with calcium. Pt reportedly passed the [place] in the ED, but then started coughing later with PO intake and was made NPO pending BSE.    Assessment / Plan / Recommendation  Clinical Impression  Clinical swallow evaluation completed at bedside with family present (son and spouse). Pt currently quite lethargic due to Ativan given earlier today for procedure. Family shares that Pt has had a tremor for some time and took Gabapentin, but was then referred to Dr. Gerilyn Pilgrim and started on Amantidine in early August for possible Parkinson's. Pt typically consumes regular textures and all liquids at home with only occasional coughing noted, per  spouse. Pt unable to participate in thorough oral motor examination at this time due to lethargy, however she responded to oral care with lingual movement and labial closure. She accepted single ice chips and manipulated with eventual suspected pharyngeal swallow over several spaced trials. Pt is not alert enough for additional trials and should remain NPO until alertness improves. Ok for single ice chips if alert and PO medications in puree when alert. SLP will check back later this evening to see if Ativan has worn off. Above discussed with family and RN.   SLP Visit Diagnosis: Dysphagia, unspecified (R13.10)    Aspiration Risk  Mild aspiration risk;Risk for inadequate nutrition/hydration    Diet Recommendation NPO except meds;Ice chips PRN after oral care   Medication Administration: Whole meds with puree Supervision: Staff to assist with self feeding Compensations: Slow rate;Small sips/bites Postural Changes: Seated upright at 90 degrees;Remain upright for at least 30 minutes after po intake    Other  Recommendations Oral Care Recommendations: Oral care prior to ice chip/H20 Other Recommendations: Clarify dietary restrictions    Recommendations for follow up therapy are one component of a multi-disciplinary discharge planning process, led by the attending physician.  Recommendations may be updated based on patient status, additional functional criteria and insurance authorization.  Follow up Recommendations Skilled nursing-short term rehab (<3 hours/day)      Assistance Recommended at Discharge Frequent or constant Supervision/Assistance  Functional Status Assessment Patient has had a recent decline in their functional status and demonstrates the ability to make significant improvements in function in a reasonable and predictable amount of time.  Frequency and Duration min 2x/week  1 week  Prognosis Prognosis for Safe Diet Advancement: Fair Barriers to Reach Goals: Medication       Swallow Study   General Date of Onset: 11/03/2021 HPI: Shelby [name] is a 87 y.o. female with medical history significant of Parkinson's dementia, hyperlipidemia was brought into the emergency department by her family due to significant weakness which has been going on since last few months but has significantly progressed since last 1 week.  Patient has no other complaints such as chest pain, shortness of breath, palpitation, fever, chills, sweating, nausea, vomiting, dizziness, headache, any problem with urination however she has intermittent constipation.  According to the family, patient has been taking 3 tablets of calcium supplement every day since about last 3 years when she broke her back and she was told by her PCP to supplement herself with calcium. Pt reportedly passed the [place] in the ED, but then started coughing later with PO intake and was made NPO pending BSE. Type of Study: Bedside Swallow Evaluation Previous Swallow Assessment: N/A Diet Prior to this Study: NPO Temperature Spikes Noted: No Respiratory Status: Room air History of Recent Intubation: No Behavior/Cognition: Lethargic/Drowsy Oral Cavity Assessment: Within Functional Limits Oral Care Completed by SLP: Yes Oral Cavity - Dentition: Adequate natural dentition Vision: Impaired for self-feeding Self-Feeding Abilities: Total assist Patient Positioning: Upright in bed Baseline Vocal Quality: Not observed Volitional Cough:  (not observed) Volitional Swallow: Unable to elicit    Oral/Motor/Sensory Function Overall Oral Motor/Sensory Function:  (needs further assessment due to lethargy)   Ice Chips Ice chips: Within functional limits Presentation: Spoon   Thin Liquid Thin Liquid: Not tested    Nectar Thick Nectar Thick Liquid: Not tested   Honey Thick Honey Thick Liquid: Not tested   Puree Puree: Not tested   Solid     Solid: Not tested     Thank you,  Shelby [name],  CCC-SLP [phone]  Shelby [name] 10/21/2021,2:56 PM

## 2021-10-22 ENCOUNTER — Inpatient Hospital Stay (HOSPITAL_COMMUNITY): Payer: Medicare HMO

## 2021-10-22 ENCOUNTER — Encounter (HOSPITAL_COMMUNITY): Payer: Self-pay | Admitting: Family Medicine

## 2021-10-22 DIAGNOSIS — Z7189 Other specified counseling: Secondary | ICD-10-CM

## 2021-10-22 DIAGNOSIS — R9431 Abnormal electrocardiogram [ECG] [EKG]: Secondary | ICD-10-CM

## 2021-10-22 DIAGNOSIS — E876 Hypokalemia: Secondary | ICD-10-CM

## 2021-10-22 DIAGNOSIS — J9601 Acute respiratory failure with hypoxia: Secondary | ICD-10-CM

## 2021-10-22 DIAGNOSIS — Z515 Encounter for palliative care: Secondary | ICD-10-CM | POA: Diagnosis not present

## 2021-10-22 DIAGNOSIS — E872 Acidosis, unspecified: Secondary | ICD-10-CM

## 2021-10-22 DIAGNOSIS — I469 Cardiac arrest, cause unspecified: Secondary | ICD-10-CM | POA: Diagnosis not present

## 2021-10-22 LAB — BLOOD GAS, ARTERIAL
Acid-base deficit: 7.9 mmol/L — ABNORMAL HIGH (ref 0.0–2.0)
Bicarbonate: 18.3 mmol/L — ABNORMAL LOW (ref 20.0–28.0)
Drawn by: 27407
FIO2: 100 %
O2 Saturation: 99.7 %
Patient temperature: 37.2
pCO2 arterial: 39 mmHg (ref 32–48)
pH, Arterial: 7.28 — ABNORMAL LOW (ref 7.35–7.45)
pO2, Arterial: 267 mmHg — ABNORMAL HIGH (ref 83–108)

## 2021-10-22 LAB — RENAL FUNCTION PANEL
Albumin: 2.9 g/dL — ABNORMAL LOW (ref 3.5–5.0)
Anion gap: 5 (ref 5–15)
BUN: 34 mg/dL — ABNORMAL HIGH (ref 8–23)
CO2: 29 mmol/L (ref 22–32)
Calcium: 11.8 mg/dL — ABNORMAL HIGH (ref 8.9–10.3)
Chloride: 111 mmol/L (ref 98–111)
Creatinine, Ser: 1.03 mg/dL — ABNORMAL HIGH (ref 0.44–1.00)
GFR, Estimated: 53 mL/min — ABNORMAL LOW (ref >60)
Glucose, Bld: 137 mg/dL — ABNORMAL HIGH (ref 70–99)
Phosphorus: 1.7 mg/dL — ABNORMAL LOW (ref 2.5–4.6)
Potassium: 2.8 mmol/L — ABNORMAL LOW (ref 3.5–5.1)
Sodium: 145 mmol/L (ref 135–145)

## 2021-10-22 LAB — GLUCOSE, CAPILLARY: Glucose-Capillary: 209 mg/dL — ABNORMAL HIGH (ref 70–99)

## 2021-10-22 MED ORDER — FUROSEMIDE 10 MG/ML IJ SOLN: 20.0000 mg | Freq: Two times a day (BID) | INTRAMUSCULAR | Status: DC

## 2021-10-22 MED ORDER — EPINEPHRINE 1 MG/10ML IJ SOSY
PREFILLED_SYRINGE | INTRAMUSCULAR | Status: AC
  Administered 2021-10-22: 1 mg
  Filled 2021-10-22: qty 10

## 2021-10-22 MED ORDER — MAGNESIUM SULFATE IN D5W 1-5 GM/100ML-% IV SOLN
INTRAVENOUS | Status: AC
  Filled 2021-10-22: qty 100

## 2021-10-22 MED ORDER — SODIUM BICARBONATE 8.4 % IV SOLN
INTRAVENOUS | Status: AC
  Filled 2021-10-22: qty 150

## 2021-10-22 MED ORDER — SODIUM BICARBONATE 8.4 % IV SOLN
INTRAVENOUS | Status: AC
  Administered 2021-10-22: 50 meq
  Filled 2021-10-22: qty 50

## 2021-10-22 MED ORDER — MAGNESIUM SULFATE 2 GM/50ML IV SOLN
2.0000 g | Freq: Once | INTRAVENOUS | Status: AC
  Administered 2021-10-22: 2 g via INTRAVENOUS
  Filled 2021-10-22: qty 50

## 2021-10-22 MED ORDER — POTASSIUM CHLORIDE CRYS ER 20 MEQ PO TBCR: 40.0000 meq | EXTENDED_RELEASE_TABLET | Freq: Once | ORAL | Status: DC

## 2021-10-22 MED ORDER — EPINEPHRINE HCL 5 MG/250ML IV SOLN IN NS
0.5000 ug/min | INTRAVENOUS | Status: DC
  Administered 2021-10-22 (×2): 20 ug/min via INTRAVENOUS
  Filled 2021-10-22 (×3): qty 250

## 2021-10-22 MED ORDER — EPINEPHRINE 1 MG/10ML IJ SOSY
PREFILLED_SYRINGE | INTRAMUSCULAR | Status: AC
  Filled 2021-10-22: qty 10

## 2021-10-22 MED ORDER — POTASSIUM CHLORIDE 10 MEQ/100ML IV SOLN
10.0000 meq | INTRAVENOUS | Status: AC
  Administered 2021-10-22 (×4): 10 meq via INTRAVENOUS
  Filled 2021-10-22: qty 100

## 2021-10-22 MED ORDER — STERILE WATER FOR INJECTION IV SOLN
Freq: Once | INTRAVENOUS | Status: AC
  Filled 2021-10-22: qty 1000

## 2021-10-22 MED ORDER — MORPHINE SULFATE (PF) 2 MG/ML IV SOLN
2.0000 mg | INTRAVENOUS | Status: DC | PRN
  Administered 2021-10-22: 4 mg via INTRAVENOUS
  Filled 2021-10-22: qty 2

## 2021-10-22 MED ORDER — POTASSIUM CHLORIDE 10 MEQ/100ML IV SOLN
INTRAVENOUS | Status: AC
  Filled 2021-10-22: qty 300

## 2021-10-22 MED ORDER — EPINEPHRINE PF 1 MG/ML IJ SOLN
INTRAMUSCULAR | Status: AC
  Filled 2021-10-22: qty 1

## 2021-10-22 MED ORDER — MORPHINE SULFATE (PF) 2 MG/ML IV SOLN
INTRAVENOUS | Status: AC
  Administered 2021-10-22: 2 mg via INTRAVENOUS
  Filled 2021-10-22: qty 1

## 2021-10-25 LAB — PTH-RELATED PEPTIDE: PTH-related peptide: <2 pmol/L

## 2021-11-09 NOTE — Progress Notes (Signed)
Patient respiratory rate increased into the 40s. With labored breathing. Family educated on use of morphine and declined this RN to give morphine at this time.

## 2021-11-09 NOTE — Progress Notes (Signed)
Time of death 65. 2 RN verification.

## 2021-11-09 NOTE — Progress Notes (Signed)
Referred to bedside by nurse Renae Gloss. Arrived and found husband, Bill-Son, Tawanna Cooler and Todd's husband Dwayne present bedside. Patient had cardiac event early this morning and is now actively dying. She was moaning a little at first and toward end of visit more loudly.  Chaplain introduced self and talked with family about the events from last night. It is clear that husband Annette Stable is having a hard time registering what is happening. Son Tawanna Cooler is realistic but is deferring to his fathers' needs at this time.  They do not want patient to be uncomfortable on one hand and as she is passing they do not want her to suffer. On the other hand, the husband is holding onto hope that she will 'pull through'. A niece, nephew, and daughter are an hour away and consideration for full comfort at that time.  Chaplain provided prayer bedside and comfort, opportunity for reflection.  Will defer to Palliative Care to continue conversations regarding GOC.   Rev. Jolyn Lent, M.Div. Chaplain  [phone]

## 2021-11-09 NOTE — Progress Notes (Signed)
Code Blue  RN called due to patient having a 2-minute cardiac arrest and spontaneously had a ROSC.  Patient was a DNR.  She was placed on a nonrebreather.  EKG was done and showed a wide QRS rhythm with bifascicular block and prolonged QTc of 632 ms.  She was admitted for hypercalcemia and nephrology was already consulted and following. At bedside, husband and son were at bedside, patient was having agonal breaths. Labs were reviewed and patient was noted to have hypokalemia (K+ 2.8).  Last magnesium on record was 1.6 on 9/11 and this was replenished.  Bedside monitor showed PEA, epinephrine drip was ordered, however, epinephrine pushes x 5 were given pending availability of epinephrine drip with spontaneous transitory improved HR and pulse. Potassium and magnesium were replenished ABG    Component Value Date/Time   PHART 7.28 (L) Nov 11, 2021 0637   PCO2ART 39 11-11-2021 0637   PO2ART 267 (H) Nov 11, 2021 0637   HCO3 18.3 (L) 11-11-2021 0637   ACIDBASEDEF 7.9 (H) 11-11-2021 0637   O2SAT 99.7 11-11-2021 0637   1 push of bicarb was given and patient was started on bicarb drip. All that was being done and patient's prognosis were explained to the family at bedside.  Family wants to see the effect of the medication given prior to being able to take any decision regarding when to withdraw treatment. Cardiology and palliative care were consulted.   Physical Exam Gen:-Lethargic and in acute distress HEENT:- .AT, No sclera icterus Neck-Supple Neck,No JVD,.  Lungs-tachypnea.  CTAB CV-tachycardia.  S1, S2 normal Abd-  +ve B.Sounds, Abd Soft, No tenderness,    Extremity/Skin:- No  edema,   warm and dry Psych-this cannot be obtained at this time due to patient's current condition Neuro-this cannot be obtained at this time due to patient's current condition  Assessment and plan Acute respiratory failure with hypoxia secondary to cardiac arrest status post ROSC Continue IV epinephrine  drip Continue telemetry Continue supplemental oxygen to maintain O2 sat > 94% with plan to wean patient off supplemental oxygen as tolerated Cardiology was consulted and we shall await further recommendations Palliative care was consulted and we shall await further recommendations  Metabolic acidosis Continue bicarb drip Continue management as per above  Prolonged QT interval EKG personally reviewed showed a wide QRS rhythm with bifascicular block and prolonged QTc of 632 ms.  Avoid QT prolonging drugs Magnesium and potassium were replenished Continue telemetry  Hypokalemia/Hypomagnesemia These were replenished Continue to monitor potassium and magnesium levels  Critical time: 68 minutes   Critical care personally provided  managing the patient due to high probability of clinically significant and life threatening deterioration. This critical care time included obtaining a history; examining the patient, pulse oximetry; ordering and review of studies; arranging urgent treatment with development of a management plan; evaluation of patient's response of treatment; frequent reassessment; and discussions with other providers.  This critical care time was performed to assess and manage the high probability of imminent and life threatening deterioration that could result in multi-organ failure.

## 2021-11-09 NOTE — Progress Notes (Signed)
OT Cancellation Note  Patient Details Name: Shelby [name] MRN: [id number] DOB: December 20, 1934   Cancelled Treatment:    Reason Eval/Treat Not Completed: Medical issues which prohibited therapy. Pt is not medically stable for therapy at this time per pt's nursing staff. Pt will be removed from OT list. Please place a new order if/when pt becomes medically appropriate. Thank you.   [facility] [place] OT, MOT   Danie Chandler Nov 10, 2021, 8:38 AM

## 2021-11-09 NOTE — Progress Notes (Addendum)
Pt  suddenly went into asystole and began having agonal breathing. Asystole x 2 min then regained pulse.Pt placed on non re-breather.   EKG performed.  Family at bedside. Dr. Thomes Dinning was called to bedside. Wardell Heath Gerrianne Scale

## 2021-11-09 NOTE — Progress Notes (Signed)
Patient's family requesting cardiology consult. Dr. Flossie Dibble made aware.

## 2021-11-09 NOTE — Progress Notes (Signed)
Chaplain at bedside with family. Family requesting morphine for patient's comfort. 2mg  given per order for dyspnea. Family wants patient to be comfortable but family requesting to hold off on comfort care measures until patient's daughter gets here.

## 2021-11-09 NOTE — Progress Notes (Signed)
PT Cancellation Note  Patient Details Name: Shelby [name] MRN: [id number] DOB: 05/27/1934   Cancelled Treatment:    Reason Eval/Treat Not Completed: Medical issues which prohibited therapy.  Patient not medically stable to participate with therapy, recommend re-order physical therapy if medical status changes.  Thank you.  9:36 AM, 10/19/2021 Ocie Bob, MPT Physical Therapist with [facility] [phone] office [phone] mobile phone

## 2021-11-09 NOTE — Progress Notes (Signed)
Family at bedside. Refusing lab draws.

## 2021-11-09 NOTE — Progress Notes (Addendum)
Family at bedside. All in agreement to discontinue epi gtt. Epi gtt discontinued. Dr. Flossie Dibble made aware. 4mg  of Morphine given for dyspnea and comfort.

## 2021-11-09 NOTE — Discharge Summary (Signed)
Death Summary  Shelby [name] HQI:[id number] DOB: Mar 01, 1934 DOA: 11-14-21  PCP: Elfredia Nevins, MD  Admit date: Nov 14, 2021 Date of Death: Nov 16, 2021 Time of Death: 1237/06/10     Notification: Elfredia Nevins, MD notified of death of 11-16-21   History of present illness:   Final/Principal Diagnoses:  1.   PEA -cardiac arrest  Principal Problem:   Hypercalcemia Active Problems:   Hypertensive urgency   Parkinson's disease dementia (HCC)   Elevated troponin   Generalized weakness   Dysphagia    Shelby [name] is a 87 y.o. female with medical history significant of Parkinson's dementia, hyperlipidemia admitted on 2021/11/14 with increasing confusion and found to have hypercalcemia in the setting of excessive calcium and vitamin D supplement ingestion over a long period of time prior to admission... Initially patient responded with continuous IV fluid hydration, serum calcium improved   Unfortunately in the morning of 2021-11-16 patient had a PEA cardiac arrest  Patient subsequently deteriorated steeply, palliative care was consulted, family agreed to pursue with comfort care, hospice....  Palliative care was consulted and care was on board. Patient was uncomfortable agonal breathing, with the first dose of morphine passed away peacefully       PEA/status post cardiac arrest --CODE BLUE was called According to notes patient had a 2-minute cardiac arrest with a spontaneous ROSC Patient is DNR did not get any CPR, Epi was given EKG showed widened QRS rhythm with bifascicular block prolonged QTc of 632, potassium was low at 2.8, magnesium 1.6 Potassium and magnesium were repleted IV   Epinephrine push x5 was given --- currently on a drip Patient received 1 push of bicarb--currently on bicarb drip Continuing supplemental oxygen   Prognosis seem to be grim.   Discussed with family they would like to input from cardiology.   Palliative care consulted.... Anticipating poor  prognosis, in-hospital death due to acute deterioration   Prolonged QTc interval -QTc 632 ms on the EKG earlier this morning Repleting electrolytes, avoiding QT prolonging meds   Hypokalemia/hypomagnesemia -Potassium and magnesium has been repleted IV     Metabolic acidosis -Patient family refused repeating labs -Patient has been initiated on IV fluid, bicarb drip which will be continued for now   Hypercalcemia- On admission calcium > 150 ... Now 11.8 -Likely secondary to excessive calcium and vitamin D intake -Hypoalbuminemia noted -Nephrology consult appreciated -Continue IV fluids/hydration -Patient received pamidronate IV-.   -PTH, PTH related peptide, TSH, vitamin D all pending -Patient with worsening mental status, with groans and  moans,    Hypertensive urgency:  - Apparently no prior history of hypertension, not on antihypertensives prior to admission -Patient was started on amlodipine..... On hold now  -PRN  IV hydralazine or IV labetalol   Parkinson's disease/ambulatory disorder--- amantadine was recently increased to 2 tablets a day from 1 tablet daily -Family would like to go back to 1 tablet daily -Consider PT eval prior to discharge   Swallowing concerns--- speech eval requested-- NPO now      Elevated troponin: Elevated but flat  Family refused any further lab draw this morning November 16, 2021.   -EKG without ischemic findings -Echo with preserved EF of 60 to 35% no wall motion normalities patient does have grade 1 diastolic dysfunction with mild LVH   6)AKI vs CKD stage IIIa: - -Creatinine 1.0, 1.03 BUN 33, 34 -No prior baseline available -Hydrate - renally adjust medications, avoid nephrotoxic agents / dehydration  / hypotension   Acute metabolic encephalopathy--- multifactorial, did not improve as  hypercalcemia improved  Postcardiac arrest remain encephalopathic   due to hypercalcemia as above #1... Cannot rule out component of AKI and dehydration -CT  head without acute findings -Hydrate and manage calcium as above         Code Status:   Code Status: DNR   Family Communication: Discussed with husband and son at bedside -Advance care planning has been discussed.... Patient's remain DNR/DNI      Disposition/Follow up Care: Patient is deceased.   Discharge medications: None  The results of significant diagnostics from this hospitalization (including imaging, microbiology, ancillary and laboratory) are listed below for reference.    Significant Diagnostic Studies: DG CHEST PORT 1 VIEW  Result Date: 09-Nov-2021 CLINICAL DATA:  Asystole. EXAM: PORTABLE CHEST 1 VIEW COMPARISON:  CT chest 05/28/2014. FINDINGS: Patient is rotated to the left. Mediastinum appears normal. Heart size stable. Right perihilar and bibasilar subsegmental atelectasis. Bilateral interstitial prominence is again noted suggesting a chronic interstitial process. An active interstitial process including pneumonitis cannot be excluded. No prominent pleural effusion noted. No pneumothorax. Biapical pleural thickening consistent scarring. Degenerative changes and scoliosis thoracic spine. Lower thoracic vertebral body compression fracture cannot be excluded. IMPRESSION: 1. Right perihilar and bibasilar subsegmental atelectasis. Bilateral interstitial prominence is again noted suggesting a chronic interstitial process. An active interstitial process including pneumonitis cannot be excluded. 2. Degenerative changes scoliosis thoracic spine. Lower thoracic vertebral body compression fracture cannot be excluded. Electronically Signed   By: Marcello Moores  Register M.D.   On: Nov 09, 2021 07:53   ECHOCARDIOGRAM COMPLETE  Result Date: 10/21/2021    ECHOCARDIOGRAM REPORT   Patient Name:   Shelby [name] Date of Exam: 10/21/2021 Medical Rec #:  [id number]       Height:       64.0 in Accession #:    [id number]      Weight:       112.4 lb Date of Birth:  12-Apr-1934       BSA:          1.532 m  Patient Age:    75 years        BP:           143/90 mmHg Patient Gender: F               HR:           84 bpm. Exam Location:  [facility] Procedure: 2D Echo, Cardiac Doppler and Color Doppler Indications:    Elevated Troponin  History:        Patient has no prior history of Echocardiogram examinations.                 Signs/Symptoms:Altered Mental Status; Risk Factors:Hypertension.                 Technically difficult study due to patient movement.  Sonographer:    Wenda Low Referring Phys: [id number] RAVI PAHWANI IMPRESSIONS  1. Left ventricular ejection fraction, by estimation, is 60 to 65%. The left ventricle has normal function. The left ventricle has no regional wall motion abnormalities. There is mild left ventricular hypertrophy. Left ventricular diastolic parameters are consistent with Grade I diastolic dysfunction (impaired relaxation).  2. Right ventricular systolic function is normal. The right ventricular size is normal. There is normal pulmonary artery systolic pressure. The estimated right ventricular systolic pressure is 99991111 mmHg.  3. The mitral valve is grossly normal. Trivial mitral valve regurgitation.  4. The aortic valve is tricuspid. Aortic valve regurgitation is mild. No aortic  stenosis is present. Aortic regurgitation PHT measures 638 msec.  5. The inferior vena cava is normal in size with greater than 50% respiratory variability, suggesting right atrial pressure of 3 mmHg. Comparison(s): No prior Echocardiogram. FINDINGS  Left Ventricle: Left ventricular ejection fraction, by estimation, is 60 to 65%. The left ventricle has normal function. The left ventricle has no regional wall motion abnormalities. The left ventricular internal cavity size was normal in size. There is  mild left ventricular hypertrophy. Left ventricular diastolic parameters are consistent with Grade I diastolic dysfunction (impaired relaxation). Right Ventricle: The right ventricular size is normal. No increase in  right ventricular wall thickness. Right ventricular systolic function is normal. There is normal pulmonary artery systolic pressure. The tricuspid regurgitant velocity is 1.63 m/s, and  with an assumed right atrial pressure of 3 mmHg, the estimated right ventricular systolic pressure is 99991111 mmHg. Left Atrium: Left atrial size was normal in size. Right Atrium: Right atrial size was normal in size. Pericardium: Trivial pericardial effusion is present. The pericardial effusion is posterior to the left ventricle. Mitral Valve: The mitral valve is grossly normal. Trivial mitral valve regurgitation. MV peak gradient, 3.7 mmHg. The mean mitral valve gradient is 2.0 mmHg. Tricuspid Valve: The tricuspid valve is grossly normal. Tricuspid valve regurgitation is trivial. Aortic Valve: The aortic valve is tricuspid. Aortic valve regurgitation is mild. Aortic regurgitation PHT measures 638 msec. No aortic stenosis is present. Aortic valve mean gradient measures 4.0 mmHg. Aortic valve peak gradient measures 9.7 mmHg. Aortic  valve area, by VTI measures 2.70 cm. Pulmonic Valve: The pulmonic valve was grossly normal. Pulmonic valve regurgitation is mild. Aorta: The aortic root is normal in size and structure. Venous: The inferior vena cava is normal in size with greater than 50% respiratory variability, suggesting right atrial pressure of 3 mmHg. IAS/Shunts: No atrial level shunt detected by color flow Doppler.  LEFT VENTRICLE PLAX 2D LVIDd:         4.50 cm   Diastology LVIDs:         2.90 cm   LV e' medial:    6.74 cm/s LV PW:         1.10 cm   LV E/e' medial:  13.1 LV IVS:        1.00 cm   LV e' lateral:   6.53 cm/s LVOT diam:     2.00 cm   LV E/e' lateral: 13.5 LV SV:         92 LV SV Index:   60 LVOT Area:     3.14 cm  RIGHT VENTRICLE RV Basal diam:  2.95 cm RV Mid diam:    2.20 cm RV S prime:     11.50 cm/s TAPSE (M-mode): 2.7 cm LEFT ATRIUM             Index        RIGHT ATRIUM           Index LA diam:        3.30 cm 2.15  cm/m   RA Area:     17.90 cm LA Vol (A2C):   51.9 ml 33.89 ml/m  RA Volume:   47.60 ml  31.08 ml/m LA Vol (A4C):   44.5 ml 29.05 ml/m LA Biplane Vol: 48.3 ml 31.54 ml/m  AORTIC VALVE                    PULMONIC VALVE AV Area (Vmax):    2.82 cm  PV Vmax:       0.72 m/s AV Area (Vmean):   2.78 cm     PV Peak grad:  2.1 mmHg AV Area (VTI):     2.70 cm AV Vmax:           156.00 cm/s AV Vmean:          92.900 cm/s AV VTI:            0.342 m AV Peak Grad:      9.7 mmHg AV Mean Grad:      4.0 mmHg LVOT Vmax:         140.00 cm/s LVOT Vmean:        82.200 cm/s LVOT VTI:          0.294 m LVOT/AV VTI ratio: 0.86 AI PHT:            638 msec  AORTA Ao Root diam: 3.10 cm Ao Asc diam:  3.20 cm MITRAL VALVE                TRICUSPID VALVE MV Area (PHT): 5.13 cm     TR Peak grad:   10.6 mmHg MV Area VTI:   3.63 cm     TR Vmax:        163.00 cm/s MV Peak grad:  3.7 mmHg MV Mean grad:  2.0 mmHg     SHUNTS MV Vmax:       0.96 m/s     Systemic VTI:  0.29 m MV Vmean:      58.1 cm/s    Systemic Diam: 2.00 cm MV Decel Time: 148 msec MV E velocity: 88.30 cm/s MV A velocity: 105.00 cm/s MV E/A ratio:  0.84 Rozann Lesches MD Electronically signed by Rozann Lesches MD Signature Date/Time: 10/21/2021/11:59:16 AM    Final    CT HEAD WO CONTRAST (5MM)  Result Date: 10/16/2021 CLINICAL DATA:  Head trauma, minor (Age >= 65y) EXAM: CT HEAD WITHOUT CONTRAST TECHNIQUE: Contiguous axial images were obtained from the base of the skull through the vertex without intravenous contrast. RADIATION DOSE REDUCTION: This exam was performed according to the departmental dose-optimization program which includes automated exposure control, adjustment of the mA and/or kV according to patient size and/or use of iterative reconstruction technique. COMPARISON:  None Available. FINDINGS: Brain: No evidence of acute infarction, acute hemorrhage, hydrocephalus, extra-axial collection or abnormal mass effect. Area of dystrophic calcification in the right  frontoparietal region. Vascular: Calcific atherosclerosis Skull: No acute fracture. Sinuses/Orbits: Clear visualized sinuses. No acute orbital findings. Other: No mastoid effusions IMPRESSION: 1. No evidence of acute intracranial abnormality. 2. Area of dystrophic calcification in the right frontoparietal region, probably the sequela of prior nonspecific insult versus low-flow vascular malformation. Electronically Signed   By: Margaretha Sheffield M.D.   On: 10/26/2021 13:13    Microbiology: Recent Results (from the past 240 hour(s))  MRSA Next Gen by PCR, Nasal     Status: None   Collection Time: 10/18/2021  6:50 PM   Specimen: Nasal Mucosa; Nasal Swab  Result Value Ref Range Status   MRSA by PCR Next Gen NOT DETECTED NOT DETECTED Final    Comment: (NOTE) The GeneXpert MRSA Assay (FDA approved for NASAL specimens only), is one component of a comprehensive MRSA colonization surveillance program. It is not intended to diagnose MRSA infection nor to guide or monitor treatment for MRSA infections. Test performance is not FDA approved in patients less than 56 years old. Performed at [facility], [id number]  [address] [place], [place] [address]      Labs: Basic Metabolic Panel: Recent Labs  Lab 11/02/2021 1159 10/10/2021 1545 10/21/21 0531 11/18/21 0421  NA 136  137  --  139 145  K 3.7  3.8  --  3.4* 2.8*  CL 96*  97*  --  103 111  CO2 31  32  --  29 29  GLUCOSE 147*  149*  --  141* 137*  BUN 23  24*  --  33* 34*  CREATININE 1.06*  1.05*  --  1.10* 1.03*  CALCIUM >15.0*  >15.0*  --  13.8* 11.8*  MG  --  1.6*  --   --   PHOS  --   --   --  1.7*   Liver Function Tests: Recent Labs  Lab 11/08/2021 1159 10/21/21 0531 11/18/2021 0421  AST 102* 92*  --   ALT 165* 145*  --   ALKPHOS 204* 156*  --   BILITOT 1.1 0.7  --   PROT 7.0 6.1*  --   ALBUMIN 3.3* 2.8* 2.9*   Recent Labs  Lab 10/31/2021 1159  LIPASE 24   No results for input(s): "AMMONIA" in the last 168 hours. CBC: Recent Labs   Lab 10/30/2021 1159 10/21/21 0531  WBC 9.6  9.3 9.4  NEUTROABS  --  7.2  HGB 13.8  14.0 11.9*  HCT 41.8  41.9 36.2  MCV 94.1  93.5 95.0  PLT 226  215 209   Cardiac Enzymes: No results for input(s): "CKTOTAL", "CKMB", "CKMBINDEX", "TROPONINI" in the last 168 hours. D-Dimer No results for input(s): "DDIMER" in the last 72 hours. BNP: Invalid input(s): "POCBNP" CBG: Recent Labs  Lab 11/01/2021 1221 11-18-2021 0633  GLUCAP 119* 209*   Anemia work up No results for input(s): "VITAMINB12", "FOLATE", "FERRITIN", "TIBC", "IRON", "RETICCTPCT" in the last 72 hours. Urinalysis    Component Value Date/Time   COLORURINE YELLOW 10/14/2021 2138   APPEARANCEUR CLOUDY (A) 10/16/2021 2138   LABSPEC 1.006 10/24/2021 2138   PHURINE 7.0 10/24/2021 2138   GLUCOSEU NEGATIVE 10/27/2021 2138   HGBUR NEGATIVE 11/04/2021 2138   BILIRUBINUR NEGATIVE 11/04/2021 2138   KETONESUR NEGATIVE 10/15/2021 2138   PROTEINUR NEGATIVE 11/03/2021 2138   NITRITE NEGATIVE 10/27/2021 2138   LEUKOCYTESUR NEGATIVE 10/11/2021 2138   Sepsis Labs Recent Labs  Lab 10/30/2021 1159 10/21/21 0531  WBC 9.6  9.3 9.4    I have spent 50 minutes face to face encounter with the patient and on the ward discussing the patients care, assessment, plan and disposition with other care givers. >50% of the time was devoted  coordinating care.    SIGNED:  Kendell Bane, MD  [facility] 11-18-21, 4:00 PM Pager   If 7PM-7AM, please contact night-coverage [email or web address] Password [email or web address]

## 2021-11-09 NOTE — TOC Progression Note (Signed)
Transition of Care [facility]) - Progression Note    Patient Details  Name: Shelby [name] MRN: [id number] Date of Birth: Aug 29, 1934  Transition of Care [facility]) CM/SW Contact  Karn Cassis, [place] Phone Number: 11/09/21, 11:30 AM  Clinical Narrative:   Transition of Care [facility]) Screening Note   Patient Details  Name: Shelby [name] Date of Birth: 1934-11-05   Transition of Care [facility]) CM/SW Contact:    Karn Cassis, LCSW Phone Number: 2021/11/09, 11:30 AM  Palliative following. Pt actively dying. Family awaiting arrival of pt's daughter.    Transition of Care Department [facility]) has reviewed patient and no TOC needs have been identified at this time. We will continue to monitor patient advancement through interdisciplinary progression rounds. If new patient transition needs arise, please place a TOC consult.       Expected Discharge Plan: Home/Self Care Barriers to Discharge: Continued Medical Work up  Expected Discharge Plan and Services Expected Discharge Plan: Home/Self Care       Living arrangements for the past 2 months: Single Family Home                                       Social Determinants of Health (SDOH) Interventions    Readmission Risk Interventions     No data to display

## 2021-11-09 NOTE — Consult Note (Signed)
Consultation Note Date: Oct 23, 2021   Patient Name: Shelby [name]  DOB: [birth year]  MRN: [id number]  Age / Sex: 87 y.o., female  PCP: Elfredia Nevins, MD Referring Physician: Kendell Bane, MD  Reason for Consultation: Establishing goals of care  HPI/Patient Profile: 87 y.o. female  with past medical history of Parkinson's dementia, hyperlipidemia, admitted on 10/18/2021 with hypercalcemia and hypertensive urgency.   Clinical Assessment and Goals of Care: I have reviewed medical records including EPIC notes, labs and imaging, received report from RN, assessed the patient.  Mrs. Gillette is lying in bed.  She appears to be actively dying.  Her husband Annette Stable and son Tawanna Cooler along with Todd's husband Deniece Portela are present at bedside.    We meet to discuss diagnosis prognosis, GOC, EOL wishes, disposition and options.  I introduced Palliative Medicine as specialized medical care for people living with serious illness. It focuses on providing relief from the symptoms and stress of a serious illness. The goal is to improve quality of life for both the patient and the family.  We focused on their current illness.  We talk about Mrs. Tiedt's acute condition and the treatment plan.  The natural disease trajectory and expectations at EOL were discussed.  The difference between aggressive medical intervention and comfort care was considered in light of the patient's goals of care.   At this point family would like to continue care until daughter arrives from out of town which should be in the next few hours.  At that point they would elect comfort care.  Morphine added for comfort.  Advanced directives, concepts specific to code status, artifical feeding and hydration, and rehospitalization were considered and discussed.  Mrs. Spina is DNR.  Discussed the importance of continued conversation with family and the medical providers  regarding overall plan of care and treatment options, ensuring decisions are within the context of the patient's values and GOCs.  Questions and concerns were addressed.  The family was encouraged to call with questions or concerns.  PMT will continue to support holistically.  Conference with bedside nursing staff, chaplain, transition of care team related to patient condition, needs.   HCPOA  NEXT OF KIN -husband, Production designer, theatre/television/film.    SUMMARY OF RECOMMENDATIONS   At this point continue to treat the treatable but no CPR or intubation Awaiting for daughter to arrive from out of town for comfort care Anticipate hospital death resident relatively quickly within the next few minutes to hours   Code Status/Advance Care Planning: DNR  Symptom Management:  2-4 mg IV Morphine added for comfort  Palliative Prophylaxis:  Frequent Pain Assessment and Turn Reposition  Additional Recommendations (Limitations, Scope, Preferences): Full Comfort Care  Psycho-social/Spiritual:  Desire for further Chaplaincy support:yes Additional Recommendations: Caregiving  Support/Resources and Grief/Bereavement Support  Prognosis:  Hours - Days, hours   Discharge Planning: Anticipated Hospital Death      Primary Diagnoses: Present on Admission:  Hypercalcemia  Hypertensive urgency   I have reviewed the medical record, interviewed the patient and family,  and examined the patient. The following aspects are pertinent.  History reviewed. No pertinent past medical history. Social History   Socioeconomic History   Marital status: Married    Spouse name: Not on file   Number of children: Not on file   Years of education: Not on file   Highest education level: Not on file  Occupational History   Not on file  Tobacco Use   Smoking status: Not on file   Smokeless tobacco: Not on file  Substance and Sexual Activity   Alcohol use: Not on file   Drug use: Not on file   Sexual activity: Not on file   Other Topics Concern   Not on file  Social History Narrative   Not on file   Social Determinants of Health   Financial Resource Strain: Not on file  Food Insecurity: No Food Insecurity (10/31/2021)   Hunger Vital Sign    Worried About Running Out of Food in the Last Year: Never true    Ran Out of Food in the Last Year: Never true  Transportation Needs: No Transportation Needs (10/19/2021)   PRAPARE - Administrator, Civil Service (Medical): No    Lack of Transportation (Non-Medical): No  Physical Activity: Not on file  Stress: Not on file  Social Connections: Not on file   History reviewed. No pertinent family history. Scheduled Meds:  amantadine  100 mg Oral Daily   amLODipine  10 mg Oral Daily   aspirin EC  81 mg Oral Daily   Chlorhexidine Gluconate Cloth  6 each Topical Q0600   ezetimibe  10 mg Oral Daily   [START ON 10/23/2021] furosemide  20 mg Intravenous Q12H   Continuous Infusions:  sodium chloride 125 mL/hr at 11-01-2021 0413   epinephrine Stopped (Nov 01, 2021 1210)   PRN Meds:.acetaminophen **OR** acetaminophen, hydrALAZINE, labetalol, morphine injection, ondansetron **OR** ondansetron (ZOFRAN) IV Medications Prior to Admission:  Prior to Admission medications   Medication Sig Start Date End Date Taking? Authorizing Provider  amantadine (SYMMETREL) 100 MG capsule Take 100 mg by mouth 2 (two) times daily. 10/16/21  Yes [provider]  Ascorbic Acid (VITAMIN C) 1000 MG tablet Take 1,000 mg by mouth daily.   Yes [provider]  aspirin EC 81 MG tablet Take 81 mg by mouth daily.   Yes [provider]  Cranberry 250 MG TABS Take by mouth.   Yes [provider]  ezetimibe (ZETIA) 10 MG tablet Take 10 mg by mouth daily. 09/22/21  Yes [provider]  ibuprofen (ADVIL) 200 MG tablet Take 200 mg by mouth every 6 (six) hours as needed.   Yes [provider]  Multiple Vitamins-Minerals (MULTIVITAMINS THER. W/MINERALS)  TABS tablet Take 1 tablet by mouth daily.   Yes [provider]  Omega-3 Fatty Acids (FISH OIL PO) Take 1,000 mg by mouth daily.   Yes [provider]  OVER THE COUNTER MEDICATION Vitamin A, B12, D3   Yes [provider]  OVER THE COUNTER MEDICATION    Yes [provider]  zinc gluconate 50 MG tablet Take 50 mg by mouth daily.   Yes [provider]  gabapentin (NEURONTIN) 100 MG capsule Take 100 mg by mouth 3 (three) times daily. Patient not taking: Reported on 11/02/2021 05/26/21   [provider]   No Known Allergies Review of Systems  Unable to perform ROS: Severe respiratory distress    Physical Exam Vitals and nursing note reviewed.  Constitutional:  General: She is in acute distress.     Appearance: She is ill-appearing.  Cardiovascular:     Rate and Rhythm: Bradycardia present.     Vital Signs: BP (!) 151/57   Pulse (!) 41   Temp 99.3 F (37.4 C) (Axillary)   Resp (!) 0   Ht 5\' 4"  (1.626 m)   Wt 52.9 kg   SpO2 93%   BMI 20.02 kg/m  Pain Scale: CPOT POSS *See Group Information*: 2-Acceptable,Slightly drowsy, easily aroused Pain Score: 0-No pain   SpO2: SpO2: 93 % O2 Device:SpO2: 93 % O2 Flow Rate: .O2 Flow Rate (L/min): 15 L/min  IO: Intake/output summary:  Intake/Output Summary (Last 24 hours) at 10-28-21 1333 Last data filed at Oct 28, 2021 1225 Gross per 24 hour  Intake 3339.24 ml  Output 2500 ml  Net 839.24 ml    LBM:   Baseline Weight: Weight: 63.5 kg Most recent weight: Weight: 52.9 kg     Palliative Assessment/Data:   Flowsheet Rows    Flowsheet Row Most Recent Value  Intake Tab   Referral Department Hospitalist  Unit at Time of Referral Cardiac/Telemetry Unit  Palliative Care Primary Diagnosis Cardiac  Date Notified 28-Oct-2021  Palliative Care Type New Palliative care  Reason for referral Clarify Goals of Care  Date of Admission 10/17/2021  Date first seen by Palliative Care 2021-10-28   # of days Palliative referral response time 0 Day(s)  # of days IP prior to Palliative referral 2  Clinical Assessment   Palliative Performance Scale Score 10%  Pain Max last 24 hours Not able to report  Pain Min Last 24 hours Not able to report  Dyspnea Max Last 24 Hours Not able to report  Dyspnea Min Last 24 hours Not able to report  Psychosocial & Spiritual Assessment   Palliative Care Outcomes        Time In: 0830 Time Out: 0910 Time Total: 40 minutes  Greater than 50%  of this time was spent counseling and coordinating care related to the above assessment and plan.  Signed by: 0911, NP   Please contact Palliative Medicine Team phone at [phone] for questions and concerns.  For individual provider: See [phone]

## 2021-11-09 NOTE — Progress Notes (Signed)
[name] NEPHROLOGY PROGRESS NOTE  Assessment/ Plan: Pt is a 87 y.o. yo female with recently diagnosed Parkinson disease, HLD, undiagnosed hypertension prior to this admission presents with altered mental status, weakness seen as a consultation for hypercalcemia.  #Hypercalcemia caused by intoxication of oral vitamin D and calcium supplement.  Vitamin D level is elevated with appropriately suppressed PTH level.  PTH RP pending. Treated with IV fluid, Lasix and pamidronate with significant improvement of calcium level. Noted patient had a cardiac arrest this morning and now family leaning towards comfort measures.  #Hypertensive urgency: Blood pressure improved with antihypertensives.    #Hypokalemia: Replating KCl IV.  #Cardiac arrest/GOC: Patient had 2 minutes of asystole this morning with ROSC.  Multiple family members were present at the bedside.  Patient just got morphine for comfort and family are waiting for her daughter to arrive before making her full comfort measures.  Discussed with the bedside nurse and primary team.  She is DNR.  Please call us back with question.  Subjective: Overnight and early morning event noted.  She had a cardiac arrest.  No CPR because it is DNR.  Currently on epinephrine drip, nonrebreather and lethargic.  Family members including her husband, son and chaplain present at the bedside.   Objective Vital signs in last 24 hours: Vitals:   10/19/2021 0711 11/05/2021 0719 10/15/2021 0758 10/13/2021 0830  BP:      Pulse:   (!) 55 (!) 29  Resp:  (!) 22 18 (!) 31  Temp:      TempSrc:      SpO2:  95% 100% 100%  Weight: 52.9 kg     Height:       Weight change:   Intake/Output Summary (Last 24 hours) at 10/23/2021 0914 Last data filed at 11/05/2021 0855 Gross per 24 hour  Intake 3112.87 ml  Output 3100 ml  Net 12.87 ml       Labs: RENAL PANEL Recent Labs    Nov 14, 2021 1159 11/14/2021 1545 10/21/21 0531 11/04/2021 0421  NA 136  137  --   139 145  K 3.7  3.8  --  3.4* 2.8*  CL 96*  97*  --  103 111  CO2 31  32  --  29 29  GLUCOSE 147*  149*  --  141* 137*  BUN 23  24*  --  33* 34*  CREATININE 1.06*  1.05*  --  1.10* 1.03*  CALCIUM >15.0*  >15.0*  --  13.8* 11.8*  MG  --  1.6*  --   --   PHOS  --   --   --  1.7*  ALBUMIN 3.3*  --  2.8* 2.9*     Liver Function Tests: Recent Labs  Lab 11-14-21 1159 10/21/21 0531 11/01/2021 0421  AST 102* 92*  --   ALT 165* 145*  --   ALKPHOS 204* 156*  --   BILITOT 1.1 0.7  --   PROT 7.0 6.1*  --   ALBUMIN 3.3* 2.8* 2.9*   Recent Labs  Lab 2021-11-14 1159  LIPASE 24   No results for input(s): "AMMONIA" in the last 168 hours. CBC: Recent Labs    11/14/21 1159 10/21/21 0531  HGB 13.8  14.0 11.9*  MCV 94.1  93.5 95.0    Cardiac Enzymes: No results for input(s): "CKTOTAL", "CKMB", "CKMBINDEX", "TROPONINI" in the last 168 hours. CBG: Recent Labs  Lab 11/14/21 1221 10/30/2021 0633  GLUCAP 119* 209*    Iron Studies: No results  for input(s): "IRON", "TIBC", "TRANSFERRIN", "FERRITIN" in the last 72 hours. Studies/Results: DG CHEST PORT 1 VIEW  Result Date: 10/15/2021 CLINICAL DATA:  Asystole. EXAM: PORTABLE CHEST 1 VIEW COMPARISON:  CT chest 05/28/2014. FINDINGS: Patient is rotated to the left. Mediastinum appears normal. Heart size stable. Right perihilar and bibasilar subsegmental atelectasis. Bilateral interstitial prominence is again noted suggesting a chronic interstitial process. An active interstitial process including pneumonitis cannot be excluded. No prominent pleural effusion noted. No pneumothorax. Biapical pleural thickening consistent scarring. Degenerative changes and scoliosis thoracic spine. Lower thoracic vertebral body compression fracture cannot be excluded. IMPRESSION: 1. Right perihilar and bibasilar subsegmental atelectasis. Bilateral interstitial prominence is again noted suggesting a chronic interstitial process. An active interstitial process  including pneumonitis cannot be excluded. 2. Degenerative changes scoliosis thoracic spine. Lower thoracic vertebral body compression fracture cannot be excluded. Electronically Signed   By: Maisie Fus  Register M.D.   On: 11/08/2021 07:53   ECHOCARDIOGRAM COMPLETE  Result Date: 10/21/2021    ECHOCARDIOGRAM REPORT   Patient Name:   Shelby [name] Date of Exam: 10/21/2021 Medical Rec #:  [id number]       Height:       64.0 in Accession #:    [id number]      Weight:       112.4 lb Date of Birth:  01-Jul-1934       BSA:          1.532 m Patient Age:    87 years        BP:           143/90 mmHg Patient Gender: F               HR:           84 bpm. [facility] Procedure: 2D Echo, Cardiac Doppler and Color Doppler Indications:    Elevated Troponin  History:        Patient has no prior history of Echocardiogram examinations.                 Signs/Symptoms:Altered Mental Status; Risk Factors:Hypertension.                 Technically difficult study due to patient movement.  Sonographer:    Mikki Harbor Referring Phys: [id number] RAVI PAHWANI IMPRESSIONS  1. Left ventricular ejection fraction, by estimation, is 60 to 65%. The left ventricle has normal function. The left ventricle has no regional wall motion abnormalities. There is mild left ventricular hypertrophy. Left ventricular diastolic parameters are consistent with Grade I diastolic dysfunction (impaired relaxation).  2. Right ventricular systolic function is normal. The right ventricular size is normal. There is normal pulmonary artery systolic pressure. The estimated right ventricular systolic pressure is 13.6 mmHg.  3. The mitral valve is grossly normal. Trivial mitral valve regurgitation.  4. The aortic valve is tricuspid. Aortic valve regurgitation is mild. No aortic stenosis is present. Aortic regurgitation PHT measures 638 msec.  5. The inferior vena cava is normal in size with greater than 50% respiratory variability, suggesting right atrial  pressure of 3 mmHg. Comparison(s): No prior Echocardiogram. FINDINGS  Left Ventricle: Left ventricular ejection fraction, by estimation, is 60 to 65%. The left ventricle has normal function. The left ventricle has no regional wall motion abnormalities. The left ventricular internal cavity size was normal in size. There is  mild left ventricular hypertrophy. Left ventricular diastolic parameters are consistent with Grade I diastolic dysfunction (impaired relaxation). Right Ventricle: The right  ventricular size is normal. No increase in right ventricular wall thickness. Right ventricular systolic function is normal. There is normal pulmonary artery systolic pressure. The tricuspid regurgitant velocity is 1.63 m/s, and  with an assumed right atrial pressure of 3 mmHg, the estimated right ventricular systolic pressure is 13.6 mmHg. Left Atrium: Left atrial size was normal in size. Right Atrium: Right atrial size was normal in size. Pericardium: Trivial pericardial effusion is present. The pericardial effusion is posterior to the left ventricle. Mitral Valve: The mitral valve is grossly normal. Trivial mitral valve regurgitation. MV peak gradient, 3.7 mmHg. The mean mitral valve gradient is 2.0 mmHg. Tricuspid Valve: The tricuspid valve is grossly normal. Tricuspid valve regurgitation is trivial. Aortic Valve: The aortic valve is tricuspid. Aortic valve regurgitation is mild. Aortic regurgitation PHT measures 638 msec. No aortic stenosis is present. Aortic valve mean gradient measures 4.0 mmHg. Aortic valve peak gradient measures 9.7 mmHg. Aortic  valve area, by VTI measures 2.70 cm. Pulmonic Valve: The pulmonic valve was grossly normal. Pulmonic valve regurgitation is mild. Aorta: The aortic root is normal in size and structure. Venous: The inferior vena cava is normal in size with greater than 50% respiratory variability, suggesting right atrial pressure of 3 mmHg. IAS/Shunts: No atrial level shunt detected by color  flow Doppler.  LEFT VENTRICLE PLAX 2D LVIDd:         4.50 cm   Diastology LVIDs:         2.90 cm   LV e' medial:    6.74 cm/s LV PW:         1.10 cm   LV E/e' medial:  13.1 LV IVS:        1.00 cm   LV e' lateral:   6.53 cm/s LVOT diam:     2.00 cm   LV E/e' lateral: 13.5 LV SV:         92 LV SV Index:   60 LVOT Area:     3.14 cm  RIGHT VENTRICLE RV Basal diam:  2.95 cm RV Mid diam:    2.20 cm RV S prime:     11.50 cm/s TAPSE (M-mode): 2.7 cm LEFT ATRIUM             Index        RIGHT ATRIUM           Index LA diam:        3.30 cm 2.15 cm/m   RA Area:     17.90 cm LA Vol (A2C):   51.9 ml 33.89 ml/m  RA Volume:   47.60 ml  31.08 ml/m LA Vol (A4C):   44.5 ml 29.05 ml/m LA Biplane Vol: 48.3 ml 31.54 ml/m  AORTIC VALVE                    PULMONIC VALVE AV Area (Vmax):    2.82 cm     PV Vmax:       0.72 m/s AV Area (Vmean):   2.78 cm     PV Peak grad:  2.1 mmHg AV Area (VTI):     2.70 cm AV Vmax:           156.00 cm/s AV Vmean:          92.900 cm/s AV VTI:            0.342 m AV Peak Grad:      9.7 mmHg AV Mean Grad:      4.0 mmHg LVOT  Vmax:         140.00 cm/s LVOT Vmean:        82.200 cm/s LVOT VTI:          0.294 m LVOT/AV VTI ratio: 0.86 AI PHT:            638 msec  AORTA Ao Root diam: 3.10 cm Ao Asc diam:  3.20 cm MITRAL VALVE                TRICUSPID VALVE MV Area (PHT): 5.13 cm     TR Peak grad:   10.6 mmHg MV Area VTI:   3.63 cm     TR Vmax:        163.00 cm/s MV Peak grad:  3.7 mmHg MV Mean grad:  2.0 mmHg     SHUNTS MV Vmax:       0.96 m/s     Systemic VTI:  0.29 m MV Vmean:      58.1 cm/s    Systemic Diam: 2.00 cm MV Decel Time: 148 msec MV E velocity: 88.30 cm/s MV A velocity: 105.00 cm/s MV E/A ratio:  0.84 Nona Dell MD Electronically signed by Nona Dell MD Signature Date/Time: 10/21/2021/11:59:16 AM    Final    CT HEAD WO CONTRAST ( )  Result Date: 11/08/2021 CLINICAL DATA:  Head trauma, minor (Age >= 65y) EXAM: CT HEAD WITHOUT CONTRAST TECHNIQUE: Contiguous axial images were  obtained from the base of the skull through the vertex without intravenous contrast. RADIATION DOSE REDUCTION: This exam was performed according to the departmental dose-optimization program which includes automated exposure control, adjustment of the mA and/or kV according to patient size and/or use of iterative reconstruction technique. COMPARISON:  None Available. FINDINGS: Brain: No evidence of acute infarction, acute hemorrhage, hydrocephalus, extra-axial collection or abnormal mass effect. Area of dystrophic calcification in the right frontoparietal region. Vascular: Calcific atherosclerosis Skull: No acute fracture. Sinuses/Orbits: Clear visualized sinuses. No acute orbital findings. Other: No mastoid effusions IMPRESSION: 1. No evidence of acute intracranial abnormality. 2. Area of dystrophic calcification in the right frontoparietal region, probably the sequela of prior nonspecific insult versus low-flow vascular malformation. Electronically Signed   By: Feliberto Harts M.D.   On: 10/14/2021 13:13    Medications: Infusions:  sodium chloride 125 mL/hr at 2021-11-02 0413   epinephrine 20 mcg/min (11/02/2021 0855)   potassium chloride 10 mEq (02-Nov-2021 0906)    Scheduled Medications:  amantadine  100 mg Oral Daily   amLODipine  10 mg Oral Daily   aspirin EC  81 mg Oral Daily   Chlorhexidine Gluconate Cloth  6 each Topical Q0600   ezetimibe  10 mg Oral Daily   [START ON 10/23/2021] furosemide  20 mg Intravenous Q12H    have reviewed scheduled and prn medications.  Physical Exam: General: Critically ill looking female, nonrebreather, lethargic. Heart:RRR, s1s2 nl Lungs:clear b/l, no crackle Abdomen:soft, non-distended Extremities:No edema Neurology: Alena Bills November 02, 2021,9:14 AM  LOS: 1 day

## 2021-11-09 NOTE — Progress Notes (Signed)
PROGRESS NOTE    Patient: Shelby [name]                            PCP: Elfredia Nevins, MD                    DOB: 1934/03/12            DOA: 10/30/21 [id number]             DOS: 10/12/2021, 12:01 PM   LOS: 1 day   Date of Service: The patient was seen and examined on 10/21/2021  Subjective:   Earlier this morning patient had a PE-CODE BLUE was called According to notes patient had a 2-minute cardiac arrest with a spontaneous ROSC Patient is DNR did not get any CPR, epi was given EKG showed widened QRS rhythm with bifascicular block prolonged QTc of 632, potassium was low at 2.8, magnesium 1.6 Potassium and magnesium were repleted IV  Epinephrine push x5 was given --- currently on a drip Patient received 1 push of bicarb--currently on bicarb drip  Prognosis seem to be grim.  Discussed with family they would like to input from cardiology.  Palliative care consulted.   The patient was seen and examined this morning, unresponsive, supplemental oxygen, Husband and son present at bedside Patient not responding to pain stimuli    Brief Narrative:   Shelby [name] is a 87 y.o. female with medical history significant of Parkinson's dementia, hyperlipidemia admitted on 10/30/2021 with increasing confusion and found to have hypercalcemia in the setting of excessive calcium and vitamin D supplement ingestion over a long period of time prior to admission... Initially patient responded with continuous IV fluid hydration, serum calcium improved  Unfortunately in the morning of 10/18/2021 patient had a PEA cardiac arrest     Assessment & Plan:   Principal Problem:   Hypercalcemia Active Problems:   Hypertensive urgency   Parkinson's disease dementia (HCC)   Elevated troponin   Generalized weakness   Dysphagia     Assessment and Plan:  PEA/status post cardiac arrest --CODE BLUE was called According to notes patient had a 2-minute cardiac arrest with a spontaneous  ROSC Patient is DNR did not get any CPR, Epi was given EKG showed widened QRS rhythm with bifascicular block prolonged QTc of 632, potassium was low at 2.8, magnesium 1.6 Potassium and magnesium were repleted IV  Epinephrine push x5 was given --- currently on a drip Patient received 1 push of bicarb--currently on bicarb drip Continuing supplemental oxygen  Prognosis seem to be grim.  Discussed with family they would like to input from cardiology.  Palliative care consulted.... Anticipating poor prognosis, in-hospital death due to acute deterioration  Prolonged QTc interval -QTc 632 ms on the EKG earlier this morning Repleting electrolytes, avoiding QT prolonging meds  Hypokalemia/hypomagnesemia -Potassium and magnesium has been repleted IV   Metabolic acidosis -Patient family refused repeating labs -Patient has been initiated on IV fluid, bicarb drip which will be continued for now  Hypercalcemia- On admission calcium > 150 ... Now 11.8 -Likely secondary to excessive calcium and vitamin D intake -Hypoalbuminemia noted -Nephrology consult appreciated -Continue IV fluids/hydration -Patient received pamidronate IV-.   -PTH, PTH related peptide, TSH, vitamin D all pending -Patient with worsening mental status, with groans and  moans,    Hypertensive urgency:  - Apparently no prior history of hypertension, not on antihypertensives prior to admission -Patient was started on amlodipine...Marland KitchenMarland Kitchen  On hold now  -PRN  IV hydralazine or IV labetalol   Parkinson's disease/ambulatory disorder--- amantadine was recently increased to 2 tablets a day from 1 tablet daily -Family would like to go back to 1 tablet daily -Consider PT eval prior to discharge   Swallowing concerns--- speech eval requested-- NPO now     Elevated troponin: Elevated but flat  Family refused any further lab draw this morning 11/04/2021.   -EKG without ischemic findings -Echo with preserved EF of 60 to 35% no wall  motion normalities patient does have grade 1 diastolic dysfunction with mild LVH   6)AKI vs CKD stage IIIa: - -Creatinine 1.0, 1.03 BUN 33, 34 -No prior baseline available -Hydrate - renally adjust medications, avoid nephrotoxic agents / dehydration  / hypotension   Acute metabolic encephalopathy--- multifactorial, did not improve as hypercalcemia improved  Postcardiac arrest remain encephalopathic  due to hypercalcemia as above #1... Cannot rule out component of AKI and dehydration -CT head without acute findings -Hydrate and manage calcium as above   ----------------------------------------------------------------------------------------------------------------------------------------------- Nutritional status:  The patient's BMI is: Body mass index is 20.02 kg/m. I agree with the assessment and plan as outlined below: Nutrition Status:     ------------------------------------------------------------------------------------------------------------------------------------------------  DVT prophylaxis:  SCDs   Code Status:   Code Status: DNR  Family Communication: Discussed with husband and son at bedside  The above findings and plan of care has been discussed with patient (and family)  in detail,  they expressed understanding and agreement of above. Requesting cardiology input, palliative care has been consulted  -Advance care planning has been discussed.... Patient's remain DNR/DNI  Admission status:   Status is: Inpatient Remains inpatient appropriate because: Needing intervention Anticipating in-hospital death due to acute deterioration, cardiac arrest, unstable     Procedures:   No admission procedures for hospital encounter.   Antimicrobials:  Anti-infectives (From admission, onward)    None        Medication:   amantadine  100 mg Oral Daily   amLODipine  10 mg Oral Daily   aspirin EC  81 mg Oral Daily   Chlorhexidine Gluconate Cloth  6 each  Topical Q0600   ezetimibe  10 mg Oral Daily   [START ON 10/23/2021] furosemide  20 mg Intravenous Q12H    acetaminophen **OR** acetaminophen, hydrALAZINE, labetalol, morphine injection, ondansetron **OR** ondansetron (ZOFRAN) IV   Objective:   Vitals:   10/10/2021 1030 10/12/2021 1111 10/31/2021 1148 11/03/2021 1150  BP:  (!) 151/57    Pulse: (!) 41 (!) 44 (!) 56 (!) 111  Resp: (!) 22 (!) 36 (!) 38 (!) 38  Temp:      TempSrc:      SpO2: 99% 98% 100% 100%  Weight:      Height:        Intake/Output Summary (Last 24 hours) at 10/21/2021 1201 Last data filed at 10/25/2021 0855 Gross per 24 hour  Intake 2945.18 ml  Output 2500 ml  Net 445.18 ml   Filed Weights   10-25-21 1137 10/21/21 0500 11/01/2021 0711  Weight: 63.5 kg 51 kg 52.9 kg     Examination:   Physical Exam  Constitution:  Alert, cooperative, no distress,  Appears calm and comfortable  Psychiatric:   Normal and stable mood and affect, cognition intact,   HEENT:        Normocephalic, PERRL, otherwise with in Normal limits  Chest:         Chest symmetric Cardio vascular:  S1/S2, RRR, No murmure, No  Rubs or Gallops  pulmonary: Clear to auscultation bilaterally, respirations unlabored, negative wheezes / crackles Abdomen: Soft, non-tender, non-distended, bowel sounds,no masses, no organomegaly Muscular skeletal: Limited exam - in bed, able to move all 4 extremities,   Neuro: CNII-XII intact. , normal motor and sensation, reflexes intact  Extremities: No pitting edema lower extremities, +2 pulses  Skin: Dry, warm to touch, negative for any Rashes, No open wounds Wounds: per nursing documentation   ------------------------------------------------------------------------------------------------------------------------------------------    LABs:     Latest Ref Rng & Units 10/21/2021    5:31 AM 10/12/2021   11:59 AM  CBC  WBC 4.0 - 10.5 K/uL 9.4  9.3    9.6   Hemoglobin 12.0 - 15.0 g/dL 65.4  65.0    35.4   Hematocrit  36.0 - 46.0 % 36.2  41.9    41.8   Platelets 150 - 400 K/uL 209  215    226       Latest Ref Rng & Units 11-11-21    4:21 AM 10/21/2021    5:31 AM 10/19/2021   11:59 AM  CMP  Glucose 70 - 99 mg/dL 656  812  751    700   BUN 8 - 23 mg/dL 34  33  24    23   Creatinine 0.44 - 1.00 mg/dL 1.74  9.44  9.67    5.91   Sodium 135 - 145 mmol/L 145  139  137    136   Potassium 3.5 - 5.1 mmol/L 2.8  3.4  3.8    3.7   Chloride 98 - 111 mmol/L 111  103  97    96   CO2 22 - 32 mmol/L 29  29  32    31   Calcium 8.9 - 10.3 mg/dL 63.8  46.6  >59.9    >35.7   Total Protein 6.5 - 8.1 g/dL  6.1  7.0   Total Bilirubin 0.3 - 1.2 mg/dL  0.7  1.1   Alkaline Phos 38 - 126 U/L  156  204   AST 15 - 41 U/L  92  102   ALT 0 - 44 U/L  145  165        Micro Results Recent Results (from the past 240 hour(s))  MRSA Next Gen by PCR, Nasal     Status: None   Collection Time: 10/23/2021  6:50 PM   Specimen: Nasal Mucosa; Nasal Swab  Result Value Ref Range Status   MRSA by PCR Next Gen NOT DETECTED NOT DETECTED Final    Comment: (NOTE) The GeneXpert MRSA Assay (FDA approved for NASAL specimens only), is one component of a comprehensive MRSA colonization surveillance program. It is not intended to diagnose MRSA infection nor to guide or monitor treatment for MRSA infections. Test performance is not FDA approved in patients less than 50 years old. Performed at [facility], [address] [place], [place] [address]     Radiology Reports DG CHEST PORT 1 VIEW  Result Date: Nov 11, 2021 CLINICAL DATA:  Asystole. EXAM: PORTABLE CHEST 1 VIEW COMPARISON:  CT chest 05/28/2014. FINDINGS: Patient is rotated to the left. Mediastinum appears normal. Heart size stable. Right perihilar and bibasilar subsegmental atelectasis. Bilateral interstitial prominence is again noted suggesting a chronic interstitial process. An active interstitial process including pneumonitis cannot be excluded. No prominent pleural effusion  noted. No pneumothorax. Biapical pleural thickening consistent scarring. Degenerative changes and scoliosis thoracic spine. Lower thoracic vertebral body compression fracture cannot be excluded. IMPRESSION:  1. Right perihilar and bibasilar subsegmental atelectasis. Bilateral interstitial prominence is again noted suggesting a chronic interstitial process. An active interstitial process including pneumonitis cannot be excluded. 2. Degenerative changes scoliosis thoracic spine. Lower thoracic vertebral body compression fracture cannot be excluded. Electronically Signed   By: Maisie Fus  Register M.D.   On: 10/21/2021 07:53    SIGNED: Kendell Bane, MD, FHM. [facility],  Pager (please use [email or web address] to page/text) Please use Epic Secure Chat for non-urgent communication (7AM-7PM)  If 7PM-7AM, please contact night-coverage [email or web address], Time 65  minutes  Critical care personally provided  managing the patient due to high probability of clinically significant and life threatening deterioration. This critical care time included obtaining a history; examining the patient, pulse oximetry; ordering and review of studies; arranging urgent treatment with development of a management plan; evaluation of patient's response of treatment; frequent reassessment; and discussions with other providers.  This critical care time was performed to assess and manage the high probability of imminent and life threatening deterioration that could result in multi-organ failure. 10/23/2021, 12:01 PM

## 2021-11-09 DEATH — deceased
